# Patient Record
Sex: Male | Born: 1990 | Race: White | Hispanic: No | Marital: Single | Smoking: Never smoker
Health system: Southern US, Community
[De-identification: ages and names within clinical notes are randomized; demographics above are authoritative.]

## PROBLEM LIST (undated history)

## (undated) DIAGNOSIS — R29898 Other symptoms and signs involving the musculoskeletal system: Secondary | ICD-10-CM

## (undated) DIAGNOSIS — R55 Syncope and collapse: Secondary | ICD-10-CM

## (undated) DIAGNOSIS — H539 Unspecified visual disturbance: Secondary | ICD-10-CM

## (undated) DIAGNOSIS — I471 Supraventricular tachycardia, unspecified: Secondary | ICD-10-CM

## (undated) HISTORY — DX: Other symptoms and signs involving the musculoskeletal system: R29.898

## (undated) HISTORY — PX: MANDIBLE FRACTURE SURGERY: SHX706

## (undated) HISTORY — DX: Unspecified visual disturbance: H53.9

## (undated) HISTORY — PX: KNEE SURGERY: SHX244

---
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
## 2013-12-15 ENCOUNTER — Encounter (HOSPITAL_COMMUNITY): Payer: Self-pay | Admitting: Emergency Medicine

## 2013-12-15 DIAGNOSIS — S1093XA Contusion of unspecified part of neck, initial encounter: Principal | ICD-10-CM

## 2013-12-15 DIAGNOSIS — S0003XA Contusion of scalp, initial encounter: Secondary | ICD-10-CM | POA: Diagnosis not present

## 2013-12-15 DIAGNOSIS — S0990XA Unspecified injury of head, initial encounter: Secondary | ICD-10-CM | POA: Diagnosis present

## 2013-12-15 DIAGNOSIS — Y9289 Other specified places as the place of occurrence of the external cause: Secondary | ICD-10-CM | POA: Insufficient documentation

## 2013-12-15 DIAGNOSIS — Z79899 Other long term (current) drug therapy: Secondary | ICD-10-CM | POA: Diagnosis not present

## 2013-12-15 DIAGNOSIS — Y9389 Activity, other specified: Secondary | ICD-10-CM | POA: Diagnosis not present

## 2013-12-15 DIAGNOSIS — S0083XA Contusion of other part of head, initial encounter: Principal | ICD-10-CM | POA: Insufficient documentation

## 2013-12-15 DIAGNOSIS — W2209XA Striking against other stationary object, initial encounter: Secondary | ICD-10-CM | POA: Diagnosis not present

## 2013-12-15 NOTE — ED Notes (Signed)
Pt. accidentally hit his head against a wall at work this evening [employer] ) , no LOC /ambulatory , alert and oriented , reports pain at right eye / right headache.

## 2013-12-16 ENCOUNTER — Emergency Department (HOSPITAL_COMMUNITY)
Admission: EM | Admit: 2013-12-16 | Discharge: 2013-12-16 | Disposition: A | Discharge status: Home / Self Care | Payer: Worker's Compensation | Attending: Emergency Medicine | Admitting: Emergency Medicine

## 2013-12-16 DIAGNOSIS — S0093XA Contusion of unspecified part of head, initial encounter: Secondary | ICD-10-CM

## 2013-12-16 MED ORDER — NAPROXEN 500 MG PO TABS: 500.0000 mg | ORAL_TABLET | Freq: Two times a day (BID) | ORAL | Status: DC

## 2013-12-16 NOTE — ED Provider Notes (Signed)
CSN: [id number]     Arrival date & time 12/15/13  2240 History   First MD Initiated Contact with Patient 12/16/13 0113     Chief Complaint  Patient presents with  . Head Injury     (Consider location/radiation/quality/duration/timing/severity/associated sxs/prior Treatment) Patient is a 23 y.o. male presenting with head injury.  Head Injury  Ian Lewis is a 23 y.o. male who reports hitting his head into a beam around 11:15pm yesterday evening. Pt reports he turned around too quickly and hit his head on an I beam. He initially felt lightheaded, but did not lose consciousness. He denies N/V, No visual disturbances. No cervical spine tenderness. He reports a HA that comes and goes and rates it at about 4-5.   However, he says he is comfortable now and does not want anything for pain. He was able to accurately complete the [facility] post-traumatic  Amnesia scale in the room. No history of coagulopathies. Denies any cp, SOB, Abd pain, weakness or numbness History reviewed. No pertinent past medical history. Past Surgical History  Procedure Laterality Date  . Knee surgery    . Mandible fracture surgery     No family history on file. History  Substance Use Topics  . Smoking status: Never Smoker   . Smokeless tobacco: Not on file  . Alcohol Use: No    Review of Systems  All other systems reviewed and are negative.     Allergies  Lorabid  Home Medications   Prior to Admission medications   Medication Sig Start Date End Date Taking? Authorizing Provider  Multiple Vitamins-Minerals (MULTIVITAMINS THER. W/MINERALS) TABS Take 1 tablet by mouth daily.      Historical Provider, MD  naproxen (NAPROSYN) 500 MG tablet Take 1 tablet (500 mg total) by mouth 2 (two) times daily. 12/16/13   Viona Gilmore Jasemine Nawaz, PA-C   BP 119/79  Pulse 69  Temp(Src) 98.3 F (36.8 C) (Oral)  Resp 19  Ht 5\' 6"  (1.676 m)  Wt 150 lb (68.04 kg)  BMI 24.22 kg/m2  SpO2 100% Physical Exam  Nursing note and  vitals reviewed. Constitutional: He is oriented to person, place, and time.  Awake, alert, nontoxic appearance.  HENT:  Localized edema and swelling superior to the R eyebrow approximately 4 cm in diameter and 2 cm in depth.  No sign of basilar skull frx, no Battle's sign or raccoon eyes.  Eyes: Conjunctivae and EOM are normal. Pupils are equal, round, and reactive to light. Right eye exhibits no discharge. Left eye exhibits no discharge. No scleral icterus.  Neck: Neck supple.  Cardiovascular: Normal rate, regular rhythm, normal heart sounds and intact distal pulses.  Exam reveals no gallop and no friction rub.   No murmur heard. Pulmonary/Chest: Effort normal. He exhibits no tenderness.  Abdominal: Soft. There is no tenderness. There is no rebound.  Musculoskeletal: He exhibits no tenderness.  Baseline ROM, no obvious new focal weakness.  Neurological: He is alert and oriented to person, place, and time.  Mental status and motor strength appears baseline for patient and situation.  Skin: No rash noted.  Psychiatric: He has a normal mood and affect.    ED Course  Procedures (including critical care time) Labs Review Labs Reviewed - No data to display  Imaging Review No results found.   EKG Interpretation None      MDM  Pt resting comfortably in ED.  Vitals stable He is alert and oriented, speaking in full sentences. No LOC, No N/V, No spine  tenderness, Passed WBTAS in room. Will DC with Aleve for pain management  Final diagnoses:  Head contusion, initial encounter        Verl Dicker, PA-C 12/16/13 0159

## 2013-12-16 NOTE — ED Provider Notes (Signed)
Medical screening examination/treatment/procedure(s) were performed by non-physician practitioner and as supervising physician I was immediately available for consultation/collaboration.    Johnna Acosta, MD 12/16/13 0400

## 2013-12-16 NOTE — Discharge Instructions (Signed)
Contusion A contusion is a deep bruise. Contusions are the result of an injury that caused bleeding under the skin. The contusion may turn blue, purple, or yellow. Minor injuries will give you a painless contusion, but more severe contusions may stay painful and swollen for a few weeks.  CAUSES  A contusion is usually caused by a blow, trauma, or direct force to an area of the body. SYMPTOMS   Swelling and redness of the injured area.  Bruising of the injured area.  Tenderness and soreness of the injured area.  Pain. DIAGNOSIS  The diagnosis can be made by taking a history and physical exam. An X-ray, CT scan, or MRI may be needed to determine if there were any associated injuries, such as fractures. TREATMENT  Specific treatment will depend on what area of the body was injured. In general, the best treatment for a contusion is resting, icing, elevating, and applying cold compresses to the injured area. Over-the-counter medicines may also be recommended for pain control. Ask your caregiver what the best treatment is for your contusion. HOME CARE INSTRUCTIONS   Put ice on the injured area.  Put ice in a plastic bag.  Place a towel between your skin and the bag.  Leave the ice on for 15-20 minutes, 3-4 times a day, or as directed by your health care provider.  Only take over-the-counter or prescription medicines for pain, discomfort, or fever as directed by your caregiver. Your caregiver may recommend avoiding anti-inflammatory medicines (aspirin, ibuprofen, and naproxen) for 48 hours because these medicines may increase bruising.  Rest the injured area.  If possible, elevate the injured area to reduce swelling. SEEK IMMEDIATE MEDICAL CARE IF:   You have increased bruising or swelling.  You have pain that is getting worse.  Your swelling or pain is not relieved with medicines. MAKE SURE YOU:   Understand these instructions.  Will watch your condition.  Will get help right  away if you are not doing well or get worse. Document Released: 02/06/2005 Document Revised: 05/04/2013 Document Reviewed: 03/04/2011 [facility] Patient Information 2015 [place], [place]. This information is not intended to replace advice given to you by your health care provider. Make sure you discuss any questions you have with your health care provider.   If symptoms progress, you begin to experience N/V, fevers, changes in vision, return to ED for re-evaluation.

## 2014-05-05 ENCOUNTER — Ambulatory Visit (INDEPENDENT_AMBULATORY_CARE_PROVIDER_SITE_OTHER): Payer: Self-pay | Admitting: Emergency Medicine

## 2014-05-05 VITALS — BP 110/78 | HR 96 | Temp 98.0°F | Resp 18 | Ht 66.0 in | Wt 158.0 lb

## 2014-05-05 DIAGNOSIS — J209 Acute bronchitis, unspecified: Secondary | ICD-10-CM

## 2014-05-05 DIAGNOSIS — J018 Other acute sinusitis: Secondary | ICD-10-CM

## 2014-05-05 MED ORDER — AMOXICILLIN-POT CLAVULANATE 875-125 MG PO TABS: 1.0000 | ORAL_TABLET | Freq: Two times a day (BID) | ORAL | Status: DC

## 2014-05-05 MED ORDER — PSEUDOEPHEDRINE-GUAIFENESIN ER 60-600 MG PO TB12: 1.0000 | ORAL_TABLET | Freq: Two times a day (BID) | ORAL | Status: AC

## 2014-05-05 MED ORDER — PROMETHAZINE-CODEINE 6.25-10 MG/5ML PO SYRP: 5.0000 mL | ORAL_SOLUTION | ORAL | Status: DC | PRN

## 2014-05-05 NOTE — Progress Notes (Signed)
[facility] [address] [place] [address] [phone]  Date:  05/05/2014   Name:  Ian Lewis   DOB:  06-11-1990   MRN:  [id number]  PCP:  No PCP Per Patient    Chief Complaint: Cough and Nasal Congestion   History of Present Illness:  Ian Lewis is a 23 y.o. very pleasant male patient who presents with the following:  Friday had ear pain associated with a sore throat. That has resolved.  Now has a cough productive of purulent sputum. No wheezing but some exertional shortness of breath.   No nausea or vomiting no stool change  no coryza No fever or chills No improvement with over the counter medications or other home remedies. Denies other complaint or health concern today.   There are no active problems to display for this patient.   History reviewed. No pertinent past medical history.  Past Surgical History  Procedure Laterality Date  . Knee surgery    . Mandible fracture surgery      History  Substance Use Topics  . Smoking status: Never Smoker   . Smokeless tobacco: Not on file  . Alcohol Use: No    History reviewed. No pertinent family history.  Allergies  Allergen Reactions  . Lorabid [Loracarbef] Hives    Medication list has been reviewed and updated.  Current Outpatient Prescriptions on File Prior to Visit  Medication Sig Dispense Refill  . Multiple Vitamins-Minerals (MULTIVITAMINS THER. W/MINERALS) TABS Take 1 tablet by mouth daily.      . naproxen (NAPROSYN) 500 MG tablet Take 1 tablet (500 mg total) by mouth 2 (two) times daily. (Patient not taking: Reported on 05/05/2014) 30 tablet 0   No current facility-administered medications on file prior to visit.    Review of Systems:  As per HPI, otherwise negative.    Physical Examination: Filed Vitals:   05/05/14 1048  BP: 110/78  Pulse: 96  Temp: 98 F (36.7 C)  Resp: 18   Filed Vitals:   05/05/14 1048  Height: 5\' 6"  (1.676 m)  Weight: 158 lb  (71.668 kg)   Body mass index is 25.51 kg/(m^2). Ideal Body Weight: Weight in (lb) to have BMI = 25: 154.6  GEN: WDWN, NAD, Non-toxic, A & O x 3 HEENT: Atraumatic, Normocephalic. Neck supple. No masses, No LAD. Ears and Nose: No external deformity.  Purulent nasal drainage CV: RRR, No M/G/R. No JVD. No thrill. No extra heart sounds. PULM: CTA B, no wheezes, crackles, rhonchi. No retractions. No resp. distress. No accessory muscle use. ABD: S, NT, ND, +BS. No rebound. No HSM. EXTR: No c/c/e NEURO Normal gait.  PSYCH: Normally interactive. Conversant. Not depressed or anxious appearing.  Calm demeanor.    Assessment and Plan: Sinusitis Bronchitis augmentin mucinex  Phen c c od  Signed,  Ellison Carwin, MD

## 2014-05-05 NOTE — Patient Instructions (Signed)

## 2016-09-17 ENCOUNTER — Encounter (HOSPITAL_COMMUNITY): Payer: Self-pay | Admitting: Emergency Medicine

## 2016-09-17 ENCOUNTER — Emergency Department (HOSPITAL_COMMUNITY): Payer: Medicaid Other

## 2016-09-17 ENCOUNTER — Emergency Department (HOSPITAL_COMMUNITY)
Admission: EM | Admit: 2016-09-17 | Discharge: 2016-09-17 | Disposition: A | Discharge status: Home / Self Care | Payer: Medicaid Other | Attending: Emergency Medicine | Admitting: Emergency Medicine

## 2016-09-17 DIAGNOSIS — R55 Syncope and collapse: Secondary | ICD-10-CM | POA: Insufficient documentation

## 2016-09-17 LAB — CBC
HCT: 46 % (ref 39.0–52.0)
Hemoglobin: 16.2 g/dL (ref 13.0–17.0)
MCH: 29.6 pg (ref 26.0–34.0)
MCHC: 35.2 g/dL (ref 30.0–36.0)
MCV: 84.1 fL (ref 78.0–100.0)
PLATELETS: 243 10*3/uL (ref 150–400)
RBC: 5.47 MIL/uL (ref 4.22–5.81)
RDW: 12.3 % (ref 11.5–15.5)
WBC: 9.1 10*3/uL (ref 4.0–10.5)

## 2016-09-17 LAB — CBG MONITORING, ED: GLUCOSE-CAPILLARY: 95 mg/dL (ref 65–99)

## 2016-09-17 LAB — BASIC METABOLIC PANEL
Anion gap: 7 (ref 5–15)
BUN: 12 mg/dL (ref 6–20)
CALCIUM: 8.9 mg/dL (ref 8.9–10.3)
CHLORIDE: 108 mmol/L (ref 101–111)
CO2: 24 mmol/L (ref 22–32)
CREATININE: 0.88 mg/dL (ref 0.61–1.24)
Glucose, Bld: 67 mg/dL (ref 65–99)
Potassium: 3.5 mmol/L (ref 3.5–5.1)
Sodium: 139 mmol/L (ref 135–145)

## 2016-09-17 LAB — URINALYSIS, ROUTINE W REFLEX MICROSCOPIC
BILIRUBIN URINE: NEGATIVE
GLUCOSE, UA: NEGATIVE mg/dL
HGB URINE DIPSTICK: NEGATIVE
Ketones, ur: NEGATIVE mg/dL
Leukocytes, UA: NEGATIVE
Nitrite: NEGATIVE
PROTEIN: NEGATIVE mg/dL
Specific Gravity, Urine: 1.008 (ref 1.005–1.030)
pH: 6 (ref 5.0–8.0)

## 2016-09-17 MED ORDER — SODIUM CHLORIDE 0.9 % IV BOLUS (SEPSIS)
1000.0000 mL | Freq: Once | INTRAVENOUS | Status: AC
  Administered 2016-09-17: 1000 mL via INTRAVENOUS

## 2016-09-17 NOTE — Discharge Instructions (Signed)
Drink plenty of fluids and get plenty of rest.  Be sure to stay hydrated when working outdoors.  Return to the emergency department if your symptoms significantly worsen or change.

## 2016-09-17 NOTE — ED Notes (Signed)
ED Provider at bedside. 

## 2016-09-17 NOTE — ED Triage Notes (Signed)
Pt arrives to ER via [facility] from work place where patient had witnessed syncopal episode, no fall or injury. Pt is a/o x4. Reports tingling sensation to left foot.

## 2016-09-17 NOTE — ED Notes (Signed)
Pt reports he has been working outside all day and has had minimal water intake. EMS gave 500 cc in route NS.

## 2016-09-17 NOTE — ED Notes (Signed)
Pt sts he passed out today while at work, pt was working outside, felt hot and sat down, took 2 tylenols and started to become sleepy, sat down and per staff pt passed out and fell on ground. Per staff pt did not hit head. Pt denies pain besides left sided chest pain that started while he was waiting in the waiting room. 1/10. Pt states he does feel a little dizzy now. VSS

## 2016-09-17 NOTE — ED Notes (Signed)
Patient transported to CT 

## 2016-09-17 NOTE — ED Provider Notes (Signed)
[facility] DEPT Provider Note   CSN: [id number] Arrival date & time: 09/17/16  1529     History   Chief Complaint Chief Complaint  Patient presents with  . Loss of Consciousness    HPI Ian Lewis is a 26 y.o. male.  Patient is an otherwise healthy 26 year old male who presents for evaluation of an unresponsive episode. He works as a Microbiologist at [employer]. He has been working there the past several days in the sun performing strenuous activities. He reports becoming lightheaded, nauseated, and felt as if he was going to pass out. He then sat on the ground and became unresponsive for several moments. This was witnessed by his coworkers who deny seizure-like activity. He denies any bowel or bladder incontinence and denies any oral trauma. This episode lasted for less than 1 minute, then resolved. There is no reported postictal period.   The history is provided by the patient.  Loss of Consciousness   This is a new problem. The current episode started 1 to 2 hours ago. The problem has been resolved. He lost consciousness for a period of less than one minute. The problem is associated with exertion. Pertinent negatives include bladder incontinence, bowel incontinence, palpitations and visual change. He has tried nothing for the symptoms. The treatment provided no relief.    History reviewed. No pertinent past medical history.  There are no active problems to display for this patient.   Past Surgical History:  Procedure Laterality Date  . KNEE SURGERY    . MANDIBLE FRACTURE SURGERY         Home Medications    Prior to Admission medications   Medication Sig Start Date End Date Taking? Authorizing Provider  amoxicillin-clavulanate (AUGMENTIN) 875-125 MG per tablet Take 1 tablet by mouth 2 (two) times daily. 05/05/14   Roselee Culver, MD  Multiple Vitamins-Minerals (MULTIVITAMINS THER. W/MINERALS) TABS Take 1 tablet by mouth daily.      [provider]   naproxen (NAPROSYN) 500 MG tablet Take 1 tablet (500 mg total) by mouth 2 (two) times daily. Patient not taking: Reported on 05/05/2014 12/16/13   Comer Locket, PA-C  promethazine-codeine (PHENERGAN WITH CODEINE) 6.25-10 MG/5ML syrup Take 5-10 mLs by mouth every 4 (four) hours as needed for cough. 05/05/14   Roselee Culver, MD    Family History History reviewed. No pertinent family history.  Social History Social History  Substance Use Topics  . Smoking status: Never Smoker  . Smokeless tobacco: Never Used  . Alcohol use No     Allergies   Lorabid [loracarbef]   Review of Systems Review of Systems  Cardiovascular: Positive for syncope. Negative for palpitations.  Gastrointestinal: Negative for bowel incontinence.  Genitourinary: Negative for bladder incontinence.  All other systems reviewed and are negative.    Physical Exam Updated Vital Signs BP 129/89 (BP Location: Right Arm)   Pulse 83   Temp 98.3 F (36.8 C) (Oral)   Resp 14   SpO2 99%   Physical Exam  Constitutional: He is oriented to person, place, and time. He appears well-developed and well-nourished. No distress.  HENT:  Head: Normocephalic and atraumatic.  Mouth/Throat: Oropharynx is clear and moist.  Eyes: EOM are normal. Pupils are equal, round, and reactive to light.  Neck: Normal range of motion. Neck supple.  Cardiovascular: Normal rate and regular rhythm.  Exam reveals no friction rub.   No murmur heard. Pulmonary/Chest: Effort normal and breath sounds normal. No respiratory distress. He has no wheezes.  He has no rales.  Abdominal: Soft. Bowel sounds are normal. He exhibits no distension. There is no tenderness.  Musculoskeletal: Normal range of motion. He exhibits no edema.  Neurological: He is alert and oriented to person, place, and time. No cranial nerve deficit. He exhibits normal muscle tone. Coordination normal.  Skin: Skin is warm and dry. He is not diaphoretic.  Nursing note and  vitals reviewed.    ED Treatments / Results  Labs (all labs ordered are listed, but only abnormal results are displayed) Labs Reviewed  URINALYSIS, ROUTINE W REFLEX MICROSCOPIC - Abnormal; Notable for the following:       Result Value   Color, Urine STRAW (*)    All other components within normal limits  BASIC METABOLIC PANEL  CBC  CBG MONITORING, ED    EKG  EKG Interpretation None       Radiology No results found.  Procedures Procedures (including critical care time)  Medications Ordered in ED Medications  sodium chloride 0.9 % bolus 1,000 mL (not administered)     Initial Impression / Assessment and Plan / ED Course  I have reviewed the triage vital signs and the nursing notes.  Pertinent labs & imaging results that were available during my care of the patient were reviewed by me and considered in my medical decision making (see chart for details).  Patient presents here after some sort of syncopal episode while working outside for the past 2 days. It appears as though his symptoms are related to some sort of heat exhaustion as I have found no other obvious cause. He appears well-hydrated and his laboratory studies are unremarkable. He was given 1 L of normal saline and underwent CT scan of the head. This was negative. His vitals are stable and he appears well. He will be discharged, to follow-up as needed for any problems.  There was no reported shaking, no oral trauma, no bowel or bladder incontinence, and no confusion when he woke up that would suggest a seizure.  Final Clinical Impressions(s) / ED Diagnoses   Final diagnoses:  None    New Prescriptions New Prescriptions   No medications on file     Veryl Speak, MD 09/17/16 [phone]

## 2017-12-12 ENCOUNTER — Emergency Department (HOSPITAL_COMMUNITY)
Admission: EM | Admit: 2017-12-12 | Discharge: 2017-12-13 | Disposition: A | Discharge status: Home / Self Care | Payer: Self-pay | Attending: Emergency Medicine | Admitting: Emergency Medicine

## 2017-12-12 ENCOUNTER — Other Ambulatory Visit: Payer: Self-pay

## 2017-12-12 ENCOUNTER — Emergency Department (HOSPITAL_COMMUNITY): Payer: Self-pay

## 2017-12-12 ENCOUNTER — Encounter (HOSPITAL_COMMUNITY): Payer: Self-pay

## 2017-12-12 DIAGNOSIS — R531 Weakness: Secondary | ICD-10-CM | POA: Insufficient documentation

## 2017-12-12 DIAGNOSIS — R002 Palpitations: Secondary | ICD-10-CM | POA: Insufficient documentation

## 2017-12-12 DIAGNOSIS — R29898 Other symptoms and signs involving the musculoskeletal system: Secondary | ICD-10-CM

## 2017-12-12 DIAGNOSIS — Z79899 Other long term (current) drug therapy: Secondary | ICD-10-CM | POA: Insufficient documentation

## 2017-12-12 LAB — BASIC METABOLIC PANEL
Anion gap: 8 (ref 5–15)
BUN: 14 mg/dL (ref 6–20)
CALCIUM: 9 mg/dL (ref 8.9–10.3)
CO2: 23 mmol/L (ref 22–32)
CREATININE: 0.93 mg/dL (ref 0.61–1.24)
Chloride: 108 mmol/L (ref 98–111)
GFR calc Af Amer: >60 mL/min (ref >60)
GLUCOSE: 95 mg/dL (ref 70–99)
Potassium: 4.2 mmol/L (ref 3.5–5.1)
Sodium: 139 mmol/L (ref 135–145)

## 2017-12-12 LAB — CBC
HCT: 49.7 % (ref 39.0–52.0)
HEMOGLOBIN: 17.1 g/dL — AB (ref 13.0–17.0)
MCH: 29.9 pg (ref 26.0–34.0)
MCHC: 34.4 g/dL (ref 30.0–36.0)
MCV: 87 fL (ref 78.0–100.0)
Platelets: 300 10*3/uL (ref 150–400)
RBC: 5.71 MIL/uL (ref 4.22–5.81)
RDW: 12.1 % (ref 11.5–15.5)
WBC: 8 10*3/uL (ref 4.0–10.5)

## 2017-12-12 LAB — I-STAT TROPONIN, ED: TROPONIN I, POC: 0 ng/mL (ref 0.00–0.08)

## 2017-12-12 NOTE — ED Notes (Signed)
Checked pulse in left foot. VERY good pulses

## 2017-12-12 NOTE — ED Triage Notes (Signed)
Pt states that yesterday he started having palpitations and noticed yesterday afternoon he lost movement in his left foot. Both feet have delayed cap refill, with strong pedal pulses. Pt states he has had a similar episode in February of this year with loss of mobility that he eventually got back over a weeks time.

## 2017-12-12 NOTE — ED Triage Notes (Signed)
Pt concerned b/c his foot is cold and has dec cap refill. Based on PA assessment during "quicklook" assessment was the same then. Pt taken to triage for reassessment.

## 2017-12-12 NOTE — ED Provider Notes (Signed)
Patient placed in Quick Look pathway, seen and evaluated   Chief Complaint: Left foot paralyzed  HPI:   27 year old presents with cold, numb, paralyzed L ankle/foot. He states his symptoms started 11:50 AM yesterday and he had palpitations with this but no syncope. He went to lunch with dad and then felt like his foot was falling asleep and then 12:20PM he couldn't move his foot. He can bend the knee but can't move ankle/foot. He states the foot feels very cold. This has happened before in [place] [employer] where he was going to school. He would pass out and his L arm went numb. Feeling came back a day later. Another time his hands and feet went numb after passing out and it took a week to get better. Has had extensive testing and and vascular tests.  Everything came back normal. He came today because he thought his foot was turning blue  ROS: +Can't move foot, not feeling  Physical Exam:   Gen: No distress  Neuro: Awake and Alert  Skin: Warm    Focused Exam: Left foot: Foot appears perfused. Decreased cap refill. Subjective paralysis and numbness of the ankle and foot. 2+ DP pulse.     Initiation of care has begun. The patient has been counseled on the process, plan, and necessity for staying for the completion/evaluation, and the remainder of the medical screening examination    Recardo Evangelist, PA-C 12/12/17 Merleen Milliner, MD 12/13/17 0000

## 2017-12-13 DIAGNOSIS — M79672 Pain in left foot: Secondary | ICD-10-CM

## 2017-12-13 NOTE — ED Notes (Signed)
Vascular at bedside

## 2017-12-13 NOTE — Progress Notes (Signed)
Orthopedic Tech Progress Note Patient Details:  Ian Lewis 12-Nov-1990 [id number]  Ortho Devices Type of Ortho Device: Prafo boot/shoe, Crutches Ortho Device/Splint Location: lle  Ortho Device/Splint Interventions: Ordered, Adjustment, Application   Post Interventions Patient Tolerated: Well Instructions Provided: Care of device, Adjustment of device Dr needed something for foot drop. I suggested a prafo. Dr agreed and approved so I applied the boot.  Karolee Stamps 12/13/2017, 2:26 AM

## 2017-12-13 NOTE — ED Provider Notes (Signed)
[facility] EMERGENCY DEPARTMENT Provider Note   CSN: [id number] Arrival date & time: 12/12/17  1734     History   Chief Complaint Chief Complaint  Patient presents with  . Palpitations  . Skin Discoloration    HPI Ian Lewis is a 27 y.o. male.  The history is provided by the patient.  Extremity Weakness  This is a new problem. The current episode started yesterday. The problem occurs constantly. The problem has not changed since onset.Pertinent negatives include no chest pain, no abdominal pain, no headaches and no shortness of breath. The symptoms are aggravated by walking. Nothing relieves the symptoms.  Patient reports that on August 1 he had abrupt onset of palpitations, and then later developed left foot numbness and discoloration.  He also reports weakness in the left foot.  It has not improved.  He did wait to see if there was improvement, but it did not.  He denies any other weakness or numbness in his arms or legs.  There is no headache.  No new back pain. He endorses episodes of syncope and numbness in extremities in the past while he was in college in [place].  There is no syncope at this time   PMH-none Past Surgical History:  Procedure Laterality Date  . KNEE SURGERY    . MANDIBLE FRACTURE SURGERY          Home Medications    Prior to Admission medications   Medication Sig Start Date End Date Taking? Authorizing Provider  acetaminophen (TYLENOL) 500 MG tablet Take 1,000 mg by mouth every 6 (six) hours as needed.    [provider]  amoxicillin-clavulanate (AUGMENTIN) 875-125 MG per tablet Take 1 tablet by mouth 2 (two) times daily. Patient not taking: Reported on 09/17/2016 05/05/14   Roselee Culver, MD  loratadine (CLARITIN) 10 MG tablet Take 20 mg by mouth daily as needed for allergies.    [provider]  Multiple Vitamins-Minerals (MULTIVITAMINS THER. W/MINERALS) TABS Take 1 tablet by mouth daily.       [provider]  naproxen (NAPROSYN) 500 MG tablet Take 1 tablet (500 mg total) by mouth 2 (two) times daily. Patient not taking: Reported on 05/05/2014 12/16/13   Comer Locket, PA-C  promethazine-codeine (PHENERGAN WITH CODEINE) 6.25-10 MG/5ML syrup Take 5-10 mLs by mouth every 4 (four) hours as needed for cough. Patient not taking: Reported on 09/17/2016 05/05/14   Roselee Culver, MD    Family History No family history on file.  Social History Social History   Tobacco Use  . Smoking status: Never Smoker  . Smokeless tobacco: Never Used  Substance Use Topics  . Alcohol use: No  . Drug use: No     Allergies   Lorabid [loracarbef]   Review of Systems Review of Systems  Constitutional: Negative for fever.  Respiratory: Negative for shortness of breath.   Cardiovascular: Positive for palpitations. Negative for chest pain.  Gastrointestinal: Negative for abdominal pain.  Musculoskeletal: Positive for extremity weakness. Negative for back pain and neck pain.  Skin: Positive for color change.  Neurological: Positive for weakness and numbness. Negative for syncope and headaches.  All other systems reviewed and are negative.    Physical Exam Updated Vital Signs BP 112/73   Pulse 63   Temp 98.9 F (37.2 C) (Oral)   Resp 14   Ht 1.676 m (5\' 6" )   Wt 81.6 kg (180 lb)   SpO2 97%   BMI 29.05 kg/m  Physical Exam CONSTITUTIONAL: Well developed/well nourished HEAD: Normocephalic/atraumatic EYES: EOMI/PERRL ENMT: Mucous membranes moist NECK: supple no meningeal signs CV: S1/S2 noted, no murmurs/rubs/gallops noted LUNGS: Lungs are clear to auscultation bilaterally, no apparent distress ABDOMEN: soft, nontender, no rebound or guarding GU:no cva tenderness NEURO:Awake/alert, face symmetric, no arm or leg drift is noted Equal 5/5 strength with shoulder abduction, elbow flex/extension, wrist flex/extension in upper extremities and equal hand grips  bilaterally Equal 5/5 strength with hip flexion,knee flex/extension,  Right foot dorsi/plantar flexion appropriate He is unable to dorsi/plantar flex left foot.  He is insensinate in left foot Cranial nerves 3/4/5/6/11/18/08/11/12 tested and intact No clonus.  Equal reflexes in lower extremities EXTREMITIES: left foot is cooler to touch with mild change in color. Distal cap refill delayed left foot.  Equal femoral/popliteal/DP/PT pulses in both LE SKIN: warm, color normal PSYCH: no abnormalities of mood noted    ED Treatments / Results  Labs (all labs ordered are listed, but only abnormal results are displayed) Labs Reviewed  CBC - Abnormal; Notable for the following components:      Result Value   Hemoglobin 17.1 (*)    All other components within normal limits  BASIC METABOLIC PANEL  I-STAT TROPONIN, ED    EKG EKG Interpretation  Date/Time:  Friday December 12 2017 18:24:08 EDT Ventricular Rate:  84 PR Interval:  114 QRS Duration: 78 QT Interval:  344 QTC Calculation: 406 R Axis:   82 Text Interpretation:  Sinus rhythm with marked sinus arrhythmia Otherwise normal ECG No significant change since last tracing Confirmed by Duffy Bruce [id number]) on 12/12/2017 6:44:02 PM   Radiology Dg Chest 2 View  Result Date: 12/12/2017 CLINICAL DATA:  Palpitations. EXAM: CHEST - 2 VIEW COMPARISON:  None. FINDINGS: The heart size and mediastinal contours are within normal limits. Both lungs are clear. The visualized skeletal structures are unremarkable. IMPRESSION: Normal examination. Electronically Signed   By: Claudie Revering M.D.   On: 12/12/2017 19:16    Procedures Procedures  Medications Ordered in ED Medications - No data to display   Initial Impression / Assessment and Plan / ED Course  I have reviewed the triage vital signs and the nursing notes.  Pertinent labs  results that were available during my care of the patient were reviewed by me and considered in my medical decision  making (see chart for details).     12:45 AM Unclear cause of weakness and numbness in left foot.  He has pulses, but there is a temperature difference and delayed cap refill.  No signs of A. fib at this time, but it is possible he has paroxysmal A. fib.  This could reflect a vascular/embolic phenomenon.  I discussed the case with Dr. Oneida Alar with vascular, who will see patient    Seen by vascular surgery.  They do not feel this represents an acute vascular emergency, they were concerned with a possible neurologic process.  It is only isolated to the foot .  He has no other signs of stroke.  No signs of any radiculopathy.  It is possible this is a peripheral neuropathy.  Patient was given appropriate crutches as well as a boot to assist with foot drop. Will be referred to neurology as an outpatient next week, as he may need further EMG/conduction studies Answered all questions, patient feels appropriate for discharge. Final Clinical Impressions(s) / ED Diagnoses   Final diagnoses:  Weakness of left foot    ED Discharge Orders    None  Ripley Fraise, MD 12/13/17 [phone]

## 2017-12-13 NOTE — ED Notes (Signed)
Dr.Wickline at bedside with ortho tech

## 2017-12-13 NOTE — Consult Note (Signed)
Referring Physician: Dr Christy Gentles  Patient name: Ian Lewis MRN: [id number] DOB: 04-Aug-1990 Sex: male  REASON FOR CONSULT: rule out left foot ischemia  HPI: [name] is a 27 y.o. male with several hour history of numbness coolness in left foot.  He denies chest pain.  He states he had some palpitations earlier today but has no history of afib.  He currently is unable to move the left foot.  No family history of neurologic diseases.   He had a similar episode about a year ago which affected both hands with numbness and left leg. He states that currently the foot has no sensation and feels cool.  History reviewed. No pertinent past medical history. Past Surgical History:  Procedure Laterality Date  . KNEE SURGERY    . MANDIBLE FRACTURE SURGERY      No family history on file.  SOCIAL HISTORY: Social History   Socioeconomic History  . Marital status: Single    Spouse name: Not on file  . Number of children: Not on file  . Years of education: Not on file  . Highest education level: Not on file  Occupational History  . Not on file  Social Needs  . Financial resource strain: Not on file  . Food insecurity:    Worry: Not on file    Inability: Not on file  . Transportation needs:    Medical: Not on file    Non-medical: Not on file  Tobacco Use  . Smoking status: Never Smoker  . Smokeless tobacco: Never Used  Substance and Sexual Activity  . Alcohol use: No  . Drug use: No  . Sexual activity: Not on file  Lifestyle  . Physical activity:    Days per week: Not on file    Minutes per session: Not on file  . Stress: Not on file  Relationships  . Social connections:    Talks on phone: Not on file    Gets together: Not on file    Attends religious service: Not on file    Active member of club or organization: Not on file    Attends meetings of clubs or organizations: Not on file    Relationship status: Not on file  . Intimate partner violence:    Fear of  current or ex partner: Not on file    Emotionally abused: Not on file    Physically abused: Not on file    Forced sexual activity: Not on file  Other Topics Concern  . Not on file  Social History Narrative  . Not on file    Allergies  Allergen Reactions  . Lorabid [Loracarbef] Hives    No current facility-administered medications for this encounter.    Current Outpatient Medications  Medication Sig Dispense Refill  . acetaminophen (TYLENOL) 500 MG tablet Take 1,000 mg by mouth every 6 (six) hours as needed.    Marland Kitchen amoxicillin-clavulanate (AUGMENTIN) 875-125 MG per tablet Take 1 tablet by mouth 2 (two) times daily. (Patient not taking: Reported on 09/17/2016) 20 tablet 0  . loratadine (CLARITIN) 10 MG tablet Take 20 mg by mouth daily as needed for allergies.    . Multiple Vitamins-Minerals (MULTIVITAMINS THER. W/MINERALS) TABS Take 1 tablet by mouth daily.      . naproxen (NAPROSYN) 500 MG tablet Take 1 tablet (500 mg total) by mouth 2 (two) times daily. (Patient not taking: Reported on 05/05/2014) 30 tablet 0  . promethazine-codeine (PHENERGAN WITH CODEINE) 6.25-10 MG/5ML syrup Take 5-10 mLs by  mouth every 4 (four) hours as needed for cough. (Patient not taking: Reported on 09/17/2016) 120 mL 0    ROS:   General:  No weight loss, Fever, chills  HEENT: No recent headaches, no nasal bleeding, no visual changes, no sore throat  Neurologic: No dizziness, blackouts, seizures. No recent symptoms of stroke or mini- stroke. No recent episodes of slurred speech, or temporary blindness.  Cardiac: No recent episodes of chest pain/pressure, no shortness of breath at rest.  No shortness of breath with exertion.  Denies history of atrial fibrillation   Vascular: No history of rest pain in feet.  No history of claudication.  No history of non-healing ulcer, No history of DVT   Pulmonary: No home oxygen, no productive cough, no hemoptysis,  No asthma or wheezing  Musculoskeletal:  [ ]  Arthritis, [  ] Low back pain,  [ ]  Joint pain  Hematologic:No history of hypercoagulable state.  No history of easy bleeding.  No history of anemia  Gastrointestinal: No hematochezia or melena,  No gastroesophageal reflux, no trouble swallowing  Urinary: [ ]  chronic Kidney disease, [0] on HD - [ ]  MWF or [ ]  TTHS, [ ]  Burning with urination, [ ]  Frequent urination, [ ]  Difficulty urinating;   Skin: No rashes  Psychological: No history of anxiety,  No history of depression   Physical Examination  Vitals:   12/12/17 1818 12/12/17 2257 12/13/17 0015  BP: (!) 129/95 135/82 112/73  Pulse: (!) 122 74 63  Resp: 20 14   Temp: 98.9 F (37.2 C)    TempSrc: Oral    SpO2: 100% 100% 97%  Weight: 180 lb (81.6 kg)    Height: 5\' 6"  (1.676 m)      Body mass index is 29.05 kg/m.  General:  Alert and oriented, no acute distress HEENT: Normal Neck: No JVD Pulmonary: Clear to auscultation bilaterally Cardiac: Regular Rate and Rhythm without murmur Abdomen: Soft, non-tender, non-distended, no mass Skin: No rash, feet symmetrically cool Extremity Pulses:  2+ radial, brachial, femoral, dorsalis pedis, posterior tibial pulses bilaterally Musculoskeletal: No deformity or edema  Neurologic: Upper and lower extremity motor 5/5 and symmetric with exception of left foot insensate unable to move toes or ankle  DATA:  CBC    Component Value Date/Time   WBC 8.0 12/12/2017 1820   RBC 5.71 12/12/2017 1820   HGB 17.1 (H) 12/12/2017 1820   HCT 49.7 12/12/2017 1820   PLT 300 12/12/2017 1820   MCV 87.0 12/12/2017 1820   MCH 29.9 12/12/2017 1820   MCHC 34.4 12/12/2017 1820   RDW 12.1 12/12/2017 1820   LYMPHSABS 2.3 01/09/2011 0043   MONOABS 1.1 (H) 01/09/2011 0043   EOSABS 0.1 01/09/2011 0043   BASOSABS 0.0 01/09/2011 0043    BMET    Component Value Date/Time   NA 139 12/12/2017 1820   K 4.2 12/12/2017 1820   CL 108 12/12/2017 1820   CO2 23 12/12/2017 1820   GLUCOSE 95 12/12/2017 1820   BUN 14  12/12/2017 1820   CREATININE 0.93 12/12/2017 1820   CALCIUM 9.0 12/12/2017 1820   GFRNONAA >60 12/12/2017 1820   GFRAA >60 12/12/2017 1820     ASSESSMENT:  Pt with easily palpable DP PT pulse left foot.  Doubt vascular etiology for his symptoms.  Would do further workup neurologically ?MS etc   PLAN:  No further vascular workup.  Will sign off.  Suggest Neurology eval.    Ruta Hinds, MD [facility] Office: [phone] Pager: [phone]

## 2017-12-16 ENCOUNTER — Encounter (HOSPITAL_COMMUNITY): Payer: Self-pay | Admitting: Emergency Medicine

## 2017-12-16 ENCOUNTER — Observation Stay (HOSPITAL_COMMUNITY)
Admission: EM | Admit: 2017-12-16 | Discharge: 2017-12-18 | Disposition: A | Discharge status: Home / Self Care | Payer: Self-pay | Attending: Family Medicine | Admitting: Family Medicine

## 2017-12-16 ENCOUNTER — Ambulatory Visit: Payer: Self-pay | Admitting: Neurology

## 2017-12-16 ENCOUNTER — Other Ambulatory Visit: Payer: Self-pay

## 2017-12-16 ENCOUNTER — Emergency Department (HOSPITAL_COMMUNITY): Payer: Self-pay

## 2017-12-16 DIAGNOSIS — M40204 Unspecified kyphosis, thoracic region: Secondary | ICD-10-CM | POA: Insufficient documentation

## 2017-12-16 DIAGNOSIS — M5124 Other intervertebral disc displacement, thoracic region: Secondary | ICD-10-CM | POA: Insufficient documentation

## 2017-12-16 DIAGNOSIS — M5127 Other intervertebral disc displacement, lumbosacral region: Secondary | ICD-10-CM | POA: Insufficient documentation

## 2017-12-16 DIAGNOSIS — Z8249 Family history of ischemic heart disease and other diseases of the circulatory system: Secondary | ICD-10-CM | POA: Insufficient documentation

## 2017-12-16 DIAGNOSIS — M21372 Foot drop, left foot: Secondary | ICD-10-CM | POA: Insufficient documentation

## 2017-12-16 DIAGNOSIS — F449 Dissociative and conversion disorder, unspecified: Secondary | ICD-10-CM | POA: Insufficient documentation

## 2017-12-16 DIAGNOSIS — R29898 Other symptoms and signs involving the musculoskeletal system: Secondary | ICD-10-CM | POA: Diagnosis present

## 2017-12-16 DIAGNOSIS — M4056 Lordosis, unspecified, lumbar region: Secondary | ICD-10-CM | POA: Insufficient documentation

## 2017-12-16 DIAGNOSIS — R531 Weakness: Principal | ICD-10-CM | POA: Insufficient documentation

## 2017-12-16 DIAGNOSIS — Z881 Allergy status to other antibiotic agents status: Secondary | ICD-10-CM | POA: Insufficient documentation

## 2017-12-16 DIAGNOSIS — R200 Anesthesia of skin: Secondary | ICD-10-CM

## 2017-12-16 DIAGNOSIS — M5126 Other intervertebral disc displacement, lumbar region: Secondary | ICD-10-CM | POA: Insufficient documentation

## 2017-12-16 DIAGNOSIS — D1809 Hemangioma of other sites: Secondary | ICD-10-CM | POA: Insufficient documentation

## 2017-12-16 MED ORDER — GADOBENATE DIMEGLUMINE 529 MG/ML IV SOLN
18.0000 mL | Freq: Once | INTRAVENOUS | Status: AC
  Administered 2017-12-16: 18 mL via INTRAVENOUS

## 2017-12-16 NOTE — ED Notes (Signed)
Patient remains in MRI 

## 2017-12-16 NOTE — ED Provider Notes (Signed)
[place] EMERGENCY DEPARTMENT Provider Note   CSN: [id number] Arrival date & time: 12/16/17  1358     History   Chief Complaint Chief Complaint  Patient presents with  . Foot Pain    cold numb    HPI Ian Lewis is a 27 y.o. male.  Ian Lewis is a 27 y.o. Male with history of prior left ACL repair, otherwise healthy, who presents to the emergency department for evaluation of numbness and inability to move the left foot associated with the foot feeling very cold to the touch.  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  Patient reports he was not scheduled for an appointment until mid September.  He reports he is continued to have numbness and has been unable to move the foot, but today he noticed that the foot felt extremely cold to the touch when compared to the right foot, at one point he felt that it looked wider than usual, but this resolved prior to arrival.  Numbness or weakness has not advanced up the leg, patient denies any associated pain.  Patient had some palpitations before symptoms began on 12/12/2017, but this has not recurred, no history of A. fib or heart conditions, no history of IV drug use.  Patient denies any preceding injury or surgery to the foot.  He soaked the foot and some warm water to see if this would help, but did not see any improvement has not tried anything else to treat his symptoms prior to arrival.  No associated back pain, fevers, urinary incontinence or loss of bowel control.  No associated headaches, vision changes or dizziness.     History reviewed. No pertinent past medical history.  Patient Active Problem List   Diagnosis Date Noted  . Lower extremity weakness 12/17/2017     Past Surgical History:  Procedure Laterality Date  . KNEE SURGERY    . MANDIBLE FRACTURE SURGERY          Home Medications    Prior to Admission medications   Medication Sig Start Date End Date Taking? Authorizing Provider  acetaminophen (TYLENOL) 500 MG tablet Take 1,000 mg by mouth every 6 (six) hours as needed.    [provider]  amoxicillin-clavulanate (AUGMENTIN) 875-125 MG per tablet Take 1 tablet by mouth 2 (two) times daily. Patient not taking: Reported on 09/17/2016 05/05/14   Roselee Culver, MD  loratadine (CLARITIN) 10 MG tablet Take 20 mg by mouth daily as needed for allergies.    [provider]  Multiple Vitamins-Minerals (MULTIVITAMINS THER. W/MINERALS) TABS Take 1 tablet by mouth daily.      [provider]  naproxen (NAPROSYN) 500 MG tablet Take 1 tablet (500 mg total) by mouth 2 (two) times daily. Patient not taking: Reported on 05/05/2014 12/16/13   Comer Locket, PA-C  promethazine-codeine (PHENERGAN WITH CODEINE) 6.25-10 MG/5ML syrup Take 5-10 mLs by mouth every 4 (four) hours as needed for cough. Patient not taking: Reported on 09/17/2016 05/05/14   Roselee Culver, MD    Family History History reviewed. No pertinent family history.  Social History Social History   Tobacco Use  . Smoking status: Never Smoker  . Smokeless tobacco: Never Used  Substance Use Topics  . Alcohol use: No  . Drug  use: No     Allergies   Lorabid [loracarbef]   Review of Systems Review of Systems  Constitutional: Negative for chills and fever.  HENT: Negative.   Eyes: Negative for pain and visual disturbance.  Respiratory: Negative for cough and shortness of breath.   Cardiovascular: Negative for chest pain, palpitations and leg swelling.  Gastrointestinal: Negative for abdominal pain, constipation, nausea and vomiting.  Genitourinary: Negative for difficulty urinating, dysuria and frequency.  Musculoskeletal: Negative for  arthralgias, back pain, myalgias and neck pain.  Skin: Negative for color change and rash.  Neurological: Positive for weakness and numbness.     Physical Exam Updated Vital Signs BP 121/79   Pulse 72   Temp 98.8 F (37.1 C) (Oral)   Resp (!) 22   SpO2 98%   Physical Exam  Constitutional: He is oriented to person, place, and time. He appears well-developed and well-nourished. No distress.  HENT:  Head: Normocephalic and atraumatic.  Mouth/Throat: Oropharynx is clear and moist.  Eyes: Pupils are equal, round, and reactive to light. EOM are normal. Right eye exhibits no discharge. Left eye exhibits no discharge.  Neck: Normal range of motion. Neck supple.  Tenderness to palpation over the C-spine.  Cardiovascular: Normal rate, regular rhythm, normal heart sounds and intact distal pulses.  Pulses:      Radial pulses are 2+ on the right side, and 2+ on the left side.       Dorsalis pedis pulses are 2+ on the right side, and 2+ on the left side.       Posterior tibial pulses are 2+ on the right side.  Pulmonary/Chest: Effort normal and breath sounds normal. No respiratory distress.  Respirations equal and unlabored, patient able to speak in full sentences, lungs clear to auscultation bilaterally  Abdominal: Soft. Bowel sounds are normal. He exhibits no distension and no mass. There is no tenderness. There is no guarding.  Abdomen soft, nondistended, nontender to palpation in all quadrants without guarding or peritoneal signs  Musculoskeletal:  Bilateral lower extremities appear to have normal coloration and are well perfused with 2+ DP pulses bilaterally, the left foot is somewhat colder to the touch than the right in comparison  Neurological: He is alert and oriented to person, place, and time. Coordination normal.  Patient alert and oriented, speech clear, able to follow commands. Cranial nerves III through XII are grossly intact Bilateral upper extremities with normal range of  motion, strength and sensation intact throughout. Left lower extremity with no sensation to soft touch over the left foot, sensation is intact above the ankle throughout the extremity, patient is unable to wiggle left toes or range the foot Right lower extremity with normal sensation to soft touch, normal range of motion and 5/5 strength 2+ DTRs in bilateral lower extremities.  Skin: Skin is warm and dry. Capillary refill takes less than 2 seconds. He is not diaphoretic.  Psychiatric: He has a normal mood and affect. His behavior is normal.  Nursing note and vitals reviewed.    ED Treatments / Results  Labs (all labs ordered are listed, but only abnormal results are displayed) Labs Reviewed  CBC - Abnormal; Notable for the following components:      Result Value   Hemoglobin 17.4 (*)    All other components within normal limits  BASIC METABOLIC PANEL  TSH  HIV ANTIBODY (ROUTINE TESTING)  BASIC METABOLIC PANEL  CBC    EKG None  Radiology Mr [facility] Contrast  Result Date: 12/16/2017 CLINICAL DATA:  Acute onset cold sensation LEFT foot while watching television this afternoon, persistent foot numbness. EXAM: MRI HEAD WITHOUT AND WITH CONTRAST MRI CERVICAL SPINE WITHOUT AND WITH CONTRAST MRI THORACIC AND LUMBAR SPINE WITHOUT AND WITH CONTRAST TECHNIQUE: Multiplanar, multiecho pulse sequences of the brain and surrounding structures, and cervical spine, to include the craniocervical junction and cervicothoracic junction, were obtained without and with intravenous contrast. Multiplanar and multiecho pulse sequences of the thoracic and lumbar spine were obtained without and with intravenous contrast. CONTRAST:  23mL MULTIHANCE GADOBENATE DIMEGLUMINE 529 MG/ML IV SOLN COMPARISON:  CT HEAD Sep 17, 2016 FINDINGS: MRI BRAIN FINDINGS INTRACRANIAL CONTENTS: No reduced diffusion to suggest acute ischemia or hyperacute demyelination. No susceptibility artifact to suggest hemorrhage. The  ventricles and sulci are normal for patient's age. No suspicious parenchymal signal, masses, mass effect. No abnormal intraparenchymal or extra-axial enhancement. No abnormal extra-axial fluid collections. No extra-axial masses. VASCULAR: Normal major intracranial vascular flow voids present at skull base. SKULL AND UPPER CERVICAL SPINE: No abnormal sellar expansion. No suspicious calvarial bone marrow signal. Craniocervical junction maintained. SINUSES/ORBITS: The mastoid air-cells and included paranasal sinuses are well-aerated.The included ocular globes and orbital contents are non-suspicious. OTHER: None. MRI CERVICAL SPINE FINDINGS ALIGNMENT: Maintained cervical lordosis.  No malalignment. VERTEBRAE/DISCS: Vertebral bodies are intact. Intervertebral disc morphology's and signal are normal. No abnormal or acute bone marrow signal. No abnormal osseous or disc enhancement. CORD:Cervical spinal cord is normal morphology and signal characteristics. No abnormal cord, leptomeningeal or epidural enhancement. POSTERIOR FOSSA, VERTEBRAL ARTERIES, PARASPINAL TISSUES: No MR findings of ligamentous injury. Vertebral artery flow voids present. Included posterior fossa and paraspinal soft tissues are normal. DISC LEVELS: C2-3 through C7-T1: No disc bulge, canal stenosis nor neural foraminal narrowing. MRI THORACIC SPINE FINDINGS ALIGNMENT: Maintenance of the thoracic kyphosis. No malalignment. VERTEBRAE/DISCS: Vertebral bodies are intact. Intervertebral discs morphology and signal are normal. T1 and T2 bright benign hemangioma T8. Old T11 inferior endplate Schmorl's node. No abnormal or acute bone marrow signal. No abnormal osseous or disc enhancement. CORD: Thoracic spinal cord is normal morphology and signal characteristics. No abnormal cord, LEFT-dominant or epidural enhancement. PREVERTEBRAL AND PARASPINAL SOFT TISSUES:  Normal. DISC LEVELS: Small T7-8 RIGHT central disc protrusion contacting the ventral spinal cord with  mild cord deformity, enhancing annular fissure. No canal stenosis or neural foraminal narrowing at any level. MRI LUMBAR SPINE FINDINGS SEGMENTATION: For the purposes of this report, the last well-formed intervertebral disc is reported as L5-S1. transitional anatomy with small S1-2 disc. ALIGNMENT: Maintained lumbar lordosis. No malalignment. Mild lower lumbar dextroscoliosis inferred on axial sequences. VERTEBRAE:Vertebral bodies are intact. Chronic LEFT S1 pars interarticularis defect. Intervertebral discs demonstrate normal morphology, mild desiccation L5-S1 disc. Mild chronic discogenic endplate changes I6-E7. Scattered old Schmorl's nodes. No abnormal or acute bone marrow signal. No abnormal osseous or disc enhancement. CONUS MEDULLARIS AND CAUDA EQUINA: Conus medullaris terminates at L1-2 and demonstrates normal morphology and signal characteristics. Cauda equina is normal. No abnormal cord, LEFT-dominant or epidural enhancement. PARASPINAL AND OTHER SOFT TISSUES: Included prevertebral and paraspinal soft tissues are normal. DISC LEVELS: L1-2 through L4-5: No disc bulge, canal stenosis nor neural foraminal narrowing. L5-S1: 4 mm central disc protrusion with enhancing annular fissure without traversing S1 impingement. Mild facet arthropathy. No canal stenosis or neural foraminal narrowing. IMPRESSION: MRI head: 1. Normal MRI of the head with and without contrast. MRI cervical spine: 1. Normal MRI of the cervical spine with and without contrast. MRI thoracic spine: 1. Small T7-8  disc protrusion contacting and deforming the ventral spinal cord. 2. Otherwise negative MRI of the thoracic spine with and without contrast. MRI lumbar spine: 1. L5-S1 disc protrusion and annular fissure. 2. Otherwise negative MRI of the lumbar spine with and without contrast. Electronically Signed   By: Elon Alas M.D.   On: 12/16/2017 21:23   Mr Cervical Spine W Or Wo Contrast  Result Date: 12/16/2017 CLINICAL DATA:  Acute  onset cold sensation LEFT foot while watching television this afternoon, persistent foot numbness. EXAM: MRI HEAD WITHOUT AND WITH CONTRAST MRI CERVICAL SPINE WITHOUT AND WITH CONTRAST MRI THORACIC AND LUMBAR SPINE WITHOUT AND WITH CONTRAST TECHNIQUE: Multiplanar, multiecho pulse sequences of the brain and surrounding structures, and cervical spine, to include the craniocervical junction and cervicothoracic junction, were obtained without and with intravenous contrast. Multiplanar and multiecho pulse sequences of the thoracic and lumbar spine were obtained without and with intravenous contrast. CONTRAST:  66mL MULTIHANCE GADOBENATE DIMEGLUMINE 529 MG/ML IV SOLN COMPARISON:  CT HEAD Sep 17, 2016 FINDINGS: MRI BRAIN FINDINGS INTRACRANIAL CONTENTS: No reduced diffusion to suggest acute ischemia or hyperacute demyelination. No susceptibility artifact to suggest hemorrhage. The ventricles and sulci are normal for patient's age. No suspicious parenchymal signal, masses, mass effect. No abnormal intraparenchymal or extra-axial enhancement. No abnormal extra-axial fluid collections. No extra-axial masses. VASCULAR: Normal major intracranial vascular flow voids present at skull base. SKULL AND UPPER CERVICAL SPINE: No abnormal sellar expansion. No suspicious calvarial bone marrow signal. Craniocervical junction maintained. SINUSES/ORBITS: The mastoid air-cells and included paranasal sinuses are well-aerated.The included ocular globes and orbital contents are non-suspicious. OTHER: None. MRI CERVICAL SPINE FINDINGS ALIGNMENT: Maintained cervical lordosis.  No malalignment. VERTEBRAE/DISCS: Vertebral bodies are intact. Intervertebral disc morphology's and signal are normal. No abnormal or acute bone marrow signal. No abnormal osseous or disc enhancement. CORD:Cervical spinal cord is normal morphology and signal characteristics. No abnormal cord, leptomeningeal or epidural enhancement. POSTERIOR FOSSA, VERTEBRAL ARTERIES,  PARASPINAL TISSUES: No MR findings of ligamentous injury. Vertebral artery flow voids present. Included posterior fossa and paraspinal soft tissues are normal. DISC LEVELS: C2-3 through C7-T1: No disc bulge, canal stenosis nor neural foraminal narrowing. MRI THORACIC SPINE FINDINGS ALIGNMENT: Maintenance of the thoracic kyphosis. No malalignment. VERTEBRAE/DISCS: Vertebral bodies are intact. Intervertebral discs morphology and signal are normal. T1 and T2 bright benign hemangioma T8. Old T11 inferior endplate Schmorl's node. No abnormal or acute bone marrow signal. No abnormal osseous or disc enhancement. CORD: Thoracic spinal cord is normal morphology and signal characteristics. No abnormal cord, LEFT-dominant or epidural enhancement. PREVERTEBRAL AND PARASPINAL SOFT TISSUES:  Normal. DISC LEVELS: Small T7-8 RIGHT central disc protrusion contacting the ventral spinal cord with mild cord deformity, enhancing annular fissure. No canal stenosis or neural foraminal narrowing at any level. MRI LUMBAR SPINE FINDINGS SEGMENTATION: For the purposes of this report, the last well-formed intervertebral disc is reported as L5-S1. transitional anatomy with small S1-2 disc. ALIGNMENT: Maintained lumbar lordosis. No malalignment. Mild lower lumbar dextroscoliosis inferred on axial sequences. VERTEBRAE:Vertebral bodies are intact. Chronic LEFT S1 pars interarticularis defect. Intervertebral discs demonstrate normal morphology, mild desiccation L5-S1 disc. Mild chronic discogenic endplate changes Q2-V9. Scattered old Schmorl's nodes. No abnormal or acute bone marrow signal. No abnormal osseous or disc enhancement. CONUS MEDULLARIS AND CAUDA EQUINA: Conus medullaris terminates at L1-2 and demonstrates normal morphology and signal characteristics. Cauda equina is normal. No abnormal cord, LEFT-dominant or epidural enhancement. PARASPINAL AND OTHER SOFT TISSUES: Included prevertebral and paraspinal soft tissues are normal. DISC  LEVELS: L1-2  through L4-5: No disc bulge, canal stenosis nor neural foraminal narrowing. L5-S1: 4 mm central disc protrusion with enhancing annular fissure without traversing S1 impingement. Mild facet arthropathy. No canal stenosis or neural foraminal narrowing. IMPRESSION: MRI head: 1. Normal MRI of the head with and without contrast. MRI cervical spine: 1. Normal MRI of the cervical spine with and without contrast. MRI thoracic spine: 1. Small T7-8 disc protrusion contacting and deforming the ventral spinal cord. 2. Otherwise negative MRI of the thoracic spine with and without contrast. MRI lumbar spine: 1. L5-S1 disc protrusion and annular fissure. 2. Otherwise negative MRI of the lumbar spine with and without contrast. Electronically Signed   By: Elon Alas M.D.   On: 12/16/2017 21:23   Mr Thoracic Spine W Wo Contrast  Result Date: 12/16/2017 CLINICAL DATA:  Acute onset cold sensation LEFT foot while watching television this afternoon, persistent foot numbness. EXAM: MRI HEAD WITHOUT AND WITH CONTRAST MRI CERVICAL SPINE WITHOUT AND WITH CONTRAST MRI THORACIC AND LUMBAR SPINE WITHOUT AND WITH CONTRAST TECHNIQUE: Multiplanar, multiecho pulse sequences of the brain and surrounding structures, and cervical spine, to include the craniocervical junction and cervicothoracic junction, were obtained without and with intravenous contrast. Multiplanar and multiecho pulse sequences of the thoracic and lumbar spine were obtained without and with intravenous contrast. CONTRAST:  56mL MULTIHANCE GADOBENATE DIMEGLUMINE 529 MG/ML IV SOLN COMPARISON:  CT HEAD Sep 17, 2016 FINDINGS: MRI BRAIN FINDINGS INTRACRANIAL CONTENTS: No reduced diffusion to suggest acute ischemia or hyperacute demyelination. No susceptibility artifact to suggest hemorrhage. The ventricles and sulci are normal for patient's age. No suspicious parenchymal signal, masses, mass effect. No abnormal intraparenchymal or extra-axial enhancement. No  abnormal extra-axial fluid collections. No extra-axial masses. VASCULAR: Normal major intracranial vascular flow voids present at skull base. SKULL AND UPPER CERVICAL SPINE: No abnormal sellar expansion. No suspicious calvarial bone marrow signal. Craniocervical junction maintained. SINUSES/ORBITS: The mastoid air-cells and included paranasal sinuses are well-aerated.The included ocular globes and orbital contents are non-suspicious. OTHER: None. MRI CERVICAL SPINE FINDINGS ALIGNMENT: Maintained cervical lordosis.  No malalignment. VERTEBRAE/DISCS: Vertebral bodies are intact. Intervertebral disc morphology's and signal are normal. No abnormal or acute bone marrow signal. No abnormal osseous or disc enhancement. CORD:Cervical spinal cord is normal morphology and signal characteristics. No abnormal cord, leptomeningeal or epidural enhancement. POSTERIOR FOSSA, VERTEBRAL ARTERIES, PARASPINAL TISSUES: No MR findings of ligamentous injury. Vertebral artery flow voids present. Included posterior fossa and paraspinal soft tissues are normal. DISC LEVELS: C2-3 through C7-T1: No disc bulge, canal stenosis nor neural foraminal narrowing. MRI THORACIC SPINE FINDINGS ALIGNMENT: Maintenance of the thoracic kyphosis. No malalignment. VERTEBRAE/DISCS: Vertebral bodies are intact. Intervertebral discs morphology and signal are normal. T1 and T2 bright benign hemangioma T8. Old T11 inferior endplate Schmorl's node. No abnormal or acute bone marrow signal. No abnormal osseous or disc enhancement. CORD: Thoracic spinal cord is normal morphology and signal characteristics. No abnormal cord, LEFT-dominant or epidural enhancement. PREVERTEBRAL AND PARASPINAL SOFT TISSUES:  Normal. DISC LEVELS: Small T7-8 RIGHT central disc protrusion contacting the ventral spinal cord with mild cord deformity, enhancing annular fissure. No canal stenosis or neural foraminal narrowing at any level. MRI LUMBAR SPINE FINDINGS SEGMENTATION: For the purposes  of this report, the last well-formed intervertebral disc is reported as L5-S1. transitional anatomy with small S1-2 disc. ALIGNMENT: Maintained lumbar lordosis. No malalignment. Mild lower lumbar dextroscoliosis inferred on axial sequences. VERTEBRAE:Vertebral bodies are intact. Chronic LEFT S1 pars interarticularis defect. Intervertebral discs demonstrate normal morphology, mild desiccation L5-S1 disc. Mild  chronic discogenic endplate changes T2-I7. Scattered old Schmorl's nodes. No abnormal or acute bone marrow signal. No abnormal osseous or disc enhancement. CONUS MEDULLARIS AND CAUDA EQUINA: Conus medullaris terminates at L1-2 and demonstrates normal morphology and signal characteristics. Cauda equina is normal. No abnormal cord, LEFT-dominant or epidural enhancement. PARASPINAL AND OTHER SOFT TISSUES: Included prevertebral and paraspinal soft tissues are normal. DISC LEVELS: L1-2 through L4-5: No disc bulge, canal stenosis nor neural foraminal narrowing. L5-S1: 4 mm central disc protrusion with enhancing annular fissure without traversing S1 impingement. Mild facet arthropathy. No canal stenosis or neural foraminal narrowing. IMPRESSION: MRI head: 1. Normal MRI of the head with and without contrast. MRI cervical spine: 1. Normal MRI of the cervical spine with and without contrast. MRI thoracic spine: 1. Small T7-8 disc protrusion contacting and deforming the ventral spinal cord. 2. Otherwise negative MRI of the thoracic spine with and without contrast. MRI lumbar spine: 1. L5-S1 disc protrusion and annular fissure. 2. Otherwise negative MRI of the lumbar spine with and without contrast. Electronically Signed   By: Elon Alas M.D.   On: 12/16/2017 21:23   Mr Lumbar Spine W Wo Contrast  Result Date: 12/16/2017 CLINICAL DATA:  Acute onset cold sensation LEFT foot while watching television this afternoon, persistent foot numbness. EXAM: MRI HEAD WITHOUT AND WITH CONTRAST MRI CERVICAL SPINE WITHOUT AND  WITH CONTRAST MRI THORACIC AND LUMBAR SPINE WITHOUT AND WITH CONTRAST TECHNIQUE: Multiplanar, multiecho pulse sequences of the brain and surrounding structures, and cervical spine, to include the craniocervical junction and cervicothoracic junction, were obtained without and with intravenous contrast. Multiplanar and multiecho pulse sequences of the thoracic and lumbar spine were obtained without and with intravenous contrast. CONTRAST:  1mL MULTIHANCE GADOBENATE DIMEGLUMINE 529 MG/ML IV SOLN COMPARISON:  CT HEAD Sep 17, 2016 FINDINGS: MRI BRAIN FINDINGS INTRACRANIAL CONTENTS: No reduced diffusion to suggest acute ischemia or hyperacute demyelination. No susceptibility artifact to suggest hemorrhage. The ventricles and sulci are normal for patient's age. No suspicious parenchymal signal, masses, mass effect. No abnormal intraparenchymal or extra-axial enhancement. No abnormal extra-axial fluid collections. No extra-axial masses. VASCULAR: Normal major intracranial vascular flow voids present at skull base. SKULL AND UPPER CERVICAL SPINE: No abnormal sellar expansion. No suspicious calvarial bone marrow signal. Craniocervical junction maintained. SINUSES/ORBITS: The mastoid air-cells and included paranasal sinuses are well-aerated.The included ocular globes and orbital contents are non-suspicious. OTHER: None. MRI CERVICAL SPINE FINDINGS ALIGNMENT: Maintained cervical lordosis.  No malalignment. VERTEBRAE/DISCS: Vertebral bodies are intact. Intervertebral disc morphology's and signal are normal. No abnormal or acute bone marrow signal. No abnormal osseous or disc enhancement. CORD:Cervical spinal cord is normal morphology and signal characteristics. No abnormal cord, leptomeningeal or epidural enhancement. POSTERIOR FOSSA, VERTEBRAL ARTERIES, PARASPINAL TISSUES: No MR findings of ligamentous injury. Vertebral artery flow voids present. Included posterior fossa and paraspinal soft tissues are normal. DISC LEVELS: C2-3  through C7-T1: No disc bulge, canal stenosis nor neural foraminal narrowing. MRI THORACIC SPINE FINDINGS ALIGNMENT: Maintenance of the thoracic kyphosis. No malalignment. VERTEBRAE/DISCS: Vertebral bodies are intact. Intervertebral discs morphology and signal are normal. T1 and T2 bright benign hemangioma T8. Old T11 inferior endplate Schmorl's node. No abnormal or acute bone marrow signal. No abnormal osseous or disc enhancement. CORD: Thoracic spinal cord is normal morphology and signal characteristics. No abnormal cord, LEFT-dominant or epidural enhancement. PREVERTEBRAL AND PARASPINAL SOFT TISSUES:  Normal. DISC LEVELS: Small T7-8 RIGHT central disc protrusion contacting the ventral spinal cord with mild cord deformity, enhancing annular fissure. No canal stenosis or  neural foraminal narrowing at any level. MRI LUMBAR SPINE FINDINGS SEGMENTATION: For the purposes of this report, the last well-formed intervertebral disc is reported as L5-S1. transitional anatomy with small S1-2 disc. ALIGNMENT: Maintained lumbar lordosis. No malalignment. Mild lower lumbar dextroscoliosis inferred on axial sequences. VERTEBRAE:Vertebral bodies are intact. Chronic LEFT S1 pars interarticularis defect. Intervertebral discs demonstrate normal morphology, mild desiccation L5-S1 disc. Mild chronic discogenic endplate changes Z3-G9. Scattered old Schmorl's nodes. No abnormal or acute bone marrow signal. No abnormal osseous or disc enhancement. CONUS MEDULLARIS AND CAUDA EQUINA: Conus medullaris terminates at L1-2 and demonstrates normal morphology and signal characteristics. Cauda equina is normal. No abnormal cord, LEFT-dominant or epidural enhancement. PARASPINAL AND OTHER SOFT TISSUES: Included prevertebral and paraspinal soft tissues are normal. DISC LEVELS: L1-2 through L4-5: No disc bulge, canal stenosis nor neural foraminal narrowing. L5-S1: 4 mm central disc protrusion with enhancing annular fissure without traversing S1  impingement. Mild facet arthropathy. No canal stenosis or neural foraminal narrowing. IMPRESSION: MRI head: 1. Normal MRI of the head with and without contrast. MRI cervical spine: 1. Normal MRI of the cervical spine with and without contrast. MRI thoracic spine: 1. Small T7-8 disc protrusion contacting and deforming the ventral spinal cord. 2. Otherwise negative MRI of the thoracic spine with and without contrast. MRI lumbar spine: 1. L5-S1 disc protrusion and annular fissure. 2. Otherwise negative MRI of the lumbar spine with and without contrast. Electronically Signed   By: Elon Alas M.D.   On: 12/16/2017 21:23    Procedures Procedures (including critical care time)  Medications Ordered in ED Medications  acetaminophen (TYLENOL) tablet 650 mg (has no administration in time range)    Or  acetaminophen (TYLENOL) suppository 650 mg (has no administration in time range)  ondansetron (ZOFRAN) tablet 4 mg (has no administration in time range)    Or  ondansetron (ZOFRAN) injection 4 mg (has no administration in time range)  gadobenate dimeglumine (MULTIHANCE) injection 18 mL (18 mLs Intravenous Contrast Given 12/16/17 2052)     Initial Impression / Assessment and Plan / ED Course  I have reviewed the triage vital signs and the nursing notes.  Pertinent labs & imaging results that were available during my care of the patient were reviewed by me and considered in my medical decision making (see chart for details).  Patient presents for evaluation of numbness and inability to move the left foot, he felt this was cool to the touch as well.  Patient was seen for similar symptoms 4 days ago, was evaluated by vascular surgery and this was thought to be more of a neurologic syndrome.  Patient with normal vitals.  On exam left foot does seem cooler to the touch but he has strong 2+ pulses which are equal in bilateral lower extremities, this was confirmed with Doppler.  Patient has no sensation to  soft touch over the left foot and is unable to range the ankle or toes, sensation intact above the ankle.  Symptoms is not in line with a single dermatome and is very atypical.  With strong pulses I do doubt that this is a vascular issue and more so neurologic.  Patient had a reported some palpitations, but had any reassuring EKG with no evidence of A. fib during last evaluation to suggest thromboembolic event.  Will consult neurology for imaging recommendations.  Case discussed with Dr. Leonel Ramsay with neurology, who agrees presentation is on and not typical of any obvious neurologic syndrome.  Recommends MRI of the head, cervical  spine, thoracic spine and lumbar spine, given symptoms if there is a central lesion that is likely above the lumbar spine.  MRI is ordered and plan discussed with the patient.  Patient reports numbness seems to be spreading up position currently, he reports until today he had no migration of the symptoms.  Discussed again with Dr. Leonel Ramsay who recommends continuing with plan for MRIs of brain and spine.  Return from MRI and now reports numbness has extended all the way up the left leg and he is unable to move it.  Patient continues to have strong 2+ pulse and normal coloration of the leg.  MRI of the brain is unremarkable, MRI of the cervical spine shows no acute cause for patient's symptoms.  MR of the thoracic spine shows a disc protrusion at T7-8 with some possible vertebral canal disruption, but this would not account for patient's symptoms, lumbar MRI is unremarkable as well.  Discussed these imaging results with Dr. Lorraine Lax with neurology, who reviewed imaging as well and feels that there is no obvious organic cause on MRI for patient's symptoms.  Dr. Lorraine Lax evaluated and examined the patient, who now reports that numbness has progressed up to his back and he has some tingling in his left hand.  After thorough physical examination Dr. Malen Gauze feels this is likely more of a  functional/psychogenic disorder as with distraction, patient does seem to use the leg slightly and he remains with good tone and reflexes intact, this is not concerning for an atypical presentation of Iline Oven given intact reflexes and normal muscle tone.  Despite that this is likely a functional disorder patient is unable to walk and neurology recommends admission with PT and OT evaluation.  Discussed the use of benzodiazepines to potentially help with symptoms and/or anxiety, patient vehemently denies being anxious at all, although he appears anxious on exam, and declines these medications at this time.  Consult placed to hospitalist for admission.  Case discussed with Dr. Hal Hope with [facility] hospitalist who will see and admit the patient.  Final Clinical Impressions(s) / ED Diagnoses   Final diagnoses:  Functional weakness  Numbness    ED Discharge Orders    None       Jacqlyn Larsen, [place] 12/17/17 0200    Sherwood Gambler, MD 12/18/17 1745

## 2017-12-16 NOTE — ED Triage Notes (Signed)
Pt. Stated, I started having palpitation on Thursday and then my foot started going to sleep and I couldn't move it. The Dr. Rockey Situ me we are putting you in this boot you might have foot drop. Here today because my foot became really white and cold. Soaked it in warm water for 30 min. And it was still cold. And told if any changes to come here.

## 2017-12-16 NOTE — ED Notes (Signed)
Pedal pulse confirmed w/ doppler

## 2017-12-16 NOTE — ED Notes (Addendum)
Pt states that he is losing feeling in his L leg; Pedal pulse still present; appearance unchanged from first assessment; MD aware

## 2017-12-16 NOTE — ED Provider Notes (Signed)
Patient placed in Quick Look pathway, seen and evaluated   Chief Complaint: Left foot cool to touch  HPI:   Patient presents complaining of acute onset, constant cold sensation to the left foot.  He states this began while he was watching television at around 1 PM.  He states that he noticed that his left foot was entirely white in color which did not improve when he stood up.  He states that he soaks the foot in warm water for 30 minutes without improvement.  He was seen and evaluated on 12/12/2017 for similar symptoms and vascular surgery was consulted.  They did not feel that his symptoms are vascular in etiology but recommended follow-up with neurology.  He has an appointment scheduled on September 11 with neurology.  He states that his left foot has been completely numb since that time and he has not been able to move it at all since then.  Denies back pain, fevers, urinary incontinence.  ROS: Positive for color change, numbness, weakness Negative for fevers, incontinence, back pain  Physical Exam:   Gen: No distress  Neuro: Awake and Alert  Skin: Warm    Focused Exam: 2+ DP/PT pulses bilaterally, no lower extremity edema.  Cap refill less than 3 seconds bilaterally.  No sensation to soft touch of the left foot circumferentially.  Patient will not range the left ankle or toes.   Initiation of care has begun. The patient has been counseled on the process, plan, and necessity for staying for the completion/evaluation, and the remainder of the medical screening examination    Renita Papa, PA-C 12/16/17 1430    Lajean Saver, MD 12/16/17 1504

## 2017-12-17 ENCOUNTER — Other Ambulatory Visit: Payer: Self-pay

## 2017-12-17 ENCOUNTER — Encounter (HOSPITAL_COMMUNITY): Payer: Self-pay | Admitting: Internal Medicine

## 2017-12-17 DIAGNOSIS — R29898 Other symptoms and signs involving the musculoskeletal system: Secondary | ICD-10-CM | POA: Diagnosis present

## 2017-12-17 HISTORY — DX: Other symptoms and signs involving the musculoskeletal system: R29.898

## 2017-12-17 LAB — CBC
HCT: 49 % (ref 39.0–52.0)
HCT: 48.5 % (ref 39.0–52.0)
Hemoglobin: 17.4 g/dL — ABNORMAL HIGH (ref 13.0–17.0)
Hemoglobin: 17.4 g/dL — ABNORMAL HIGH (ref 13.0–17.0)
MCH: 29.9 pg (ref 26.0–34.0)
MCH: 30.2 pg (ref 26.0–34.0)
MCHC: 35.5 g/dL (ref 30.0–36.0)
MCHC: 35.9 g/dL (ref 30.0–36.0)
MCV: 84.3 fL (ref 78.0–100.0)
MCV: 84.2 fL (ref 78.0–100.0)
Platelets: 246 10*3/uL (ref 150–400)
Platelets: 249 10*3/uL (ref 150–400)
RBC: 5.81 MIL/uL (ref 4.22–5.81)
RBC: 5.76 MIL/uL (ref 4.22–5.81)
RDW: 11.9 % (ref 11.5–15.5)
RDW: 12 % (ref 11.5–15.5)
WBC: 8.9 10*3/uL (ref 4.0–10.5)
WBC: 9.8 10*3/uL (ref 4.0–10.5)

## 2017-12-17 LAB — BASIC METABOLIC PANEL
Anion gap: 11 (ref 5–15)
Anion gap: 11 (ref 5–15)
BUN: 10 mg/dL (ref 6–20)
BUN: 10 mg/dL (ref 6–20)
CALCIUM: 9.2 mg/dL (ref 8.9–10.3)
CHLORIDE: 104 mmol/L (ref 98–111)
CO2: 22 mmol/L (ref 22–32)
CO2: 23 mmol/L (ref 22–32)
CREATININE: 0.91 mg/dL (ref 0.61–1.24)
Calcium: 9.2 mg/dL (ref 8.9–10.3)
Chloride: 104 mmol/L (ref 98–111)
Creatinine, Ser: 1.01 mg/dL (ref 0.61–1.24)
GFR calc Af Amer: >60 mL/min (ref >60)
GFR calc Af Amer: >60 mL/min (ref >60)
GFR calc non Af Amer: >60 mL/min (ref >60)
Glucose, Bld: 92 mg/dL (ref 70–99)
Glucose, Bld: 97 mg/dL (ref 70–99)
Potassium: 3.5 mmol/L (ref 3.5–5.1)
Potassium: 4 mmol/L (ref 3.5–5.1)
SODIUM: 137 mmol/L (ref 135–145)
SODIUM: 138 mmol/L (ref 135–145)

## 2017-12-17 LAB — TSH: TSH: 2.972 u[IU]/mL (ref 0.350–4.500)

## 2017-12-17 LAB — HIV ANTIBODY (ROUTINE TESTING W REFLEX): HIV SCREEN 4TH GENERATION: NONREACTIVE

## 2017-12-17 MED ORDER — ONDANSETRON HCL 4 MG/2ML IJ SOLN: 4.0000 mg | Freq: Four times a day (QID) | INTRAMUSCULAR | Status: DC | PRN

## 2017-12-17 MED ORDER — ACETAMINOPHEN 325 MG PO TABS
650.0000 mg | ORAL_TABLET | Freq: Four times a day (QID) | ORAL | Status: DC | PRN
  Administered 2017-12-17 (×2): 650 mg via ORAL
  Filled 2017-12-17 (×3): qty 2

## 2017-12-17 MED ORDER — LORAZEPAM 1 MG PO TABS
1.0000 mg | ORAL_TABLET | Freq: Once | ORAL | Status: DC
Start: 2017-12-17 — End: 2017-12-17

## 2017-12-17 MED ORDER — ACETAMINOPHEN 650 MG RE SUPP: 650.0000 mg | Freq: Four times a day (QID) | RECTAL | Status: DC | PRN

## 2017-12-17 MED ORDER — ONDANSETRON HCL 4 MG PO TABS: 4.0000 mg | ORAL_TABLET | Freq: Four times a day (QID) | ORAL | Status: DC | PRN

## 2017-12-17 MED ORDER — SODIUM CHLORIDE 0.9 % IV SOLN
INTRAVENOUS | Status: AC
  Administered 2017-12-17: 14:00:00 via INTRAVENOUS

## 2017-12-17 NOTE — Progress Notes (Signed)
Subjective: Patient admitted this morning, see detailed H&P by Dr Hal Hope 27 year old male with no medical history came with complaints of increasing left lower extremity weakness.  He was seen by vascular surgeon and was cleared by surgery and he did not have anything acute.  Patient says the symptoms have persisted for the past 24 hours.  He was also seen by neurology and was diagnosed with conversion disorder.  MRI of the thoracic lumbar spine and MRI brain did not show any abnormality which could explain patient's symptoms.  Patient also complained of urinary retention and was able to hold urine, and did urinate on his own while he was going to get straight cath.  Vitals:   12/17/17 0404 12/17/17 1543  BP: 121/80 125/81  Pulse: (!) 59 77  Resp: 17 19  Temp: 97.7 F (36.5 C) (!) 97.2 F (36.2 C)  SpO2: 98%       A/P Conversion disorder PT recommended outpatient PT Patient does not want psychiatric evaluation at this time.    [place] Hospitalist [phone]

## 2017-12-17 NOTE — Plan of Care (Signed)
  Problem: Activity: Goal: Risk for activity intolerance will decrease Outcome: Progressing   Problem: Nutrition: Goal: Adequate nutrition will be maintained Outcome: Progressing   Problem: Coping: Goal: Level of anxiety will decrease Outcome: Progressing   Problem: Elimination: Goal: Will not experience complications related to bowel motility Outcome: Progressing   Problem: Safety: Goal: Ability to remain free from injury will improve Outcome: Progressing   

## 2017-12-17 NOTE — Evaluation (Signed)
Physical Therapy Evaluation Patient Details Name: Ian Lewis MRN: [id number] DOB: 01/03/1991 Today's Date: 12/17/2017   History of Present Illness  Pt is a 27 y/o male admitted secondary to L sided weakness and numbness. Neuro was consulted and all imagining thus far has been negative. Potential psych consult. No pertinent PMH.    Clinical Impression  Pt presented supine in bed with HOB elevated, awake and willing to participate in therapy session. Prior to admission, pt reported that he has been using crutches to ambulate since initial onset of L foot numbness/weakness. Pt currently able to perform bed mobility with supervision, transfers with min guard and ambulate a short distance with RW and min guard. Pt with inconsistent presentation throughout with L LE strength testing and functional assessment. Pt on RA throughout with SPO2 in high 90's and HR in mid 70's despite pt with reported feelings of "palpitations". Pt would continue to benefit from skilled physical therapy services at this time while admitted and after d/c to address the below listed limitations in order to improve overall safety and independence with functional mobility.     Follow Up Recommendations Outpatient PT    Equipment Recommendations  None recommended by PT    Recommendations for Other Services       Precautions / Restrictions Precautions Precautions: Fall Restrictions Weight Bearing Restrictions: No      Mobility  Bed Mobility Overal bed mobility: Needs Assistance Bed Mobility: Supine to Sit     Supine to sit: Supervision     General bed mobility comments: increased time and effort, pt using bilateral UEs to assist L LE movement off of bed  Transfers Overall transfer level: Needs assistance Equipment used: Rolling walker (2 wheeled) Transfers: Sit to/from Stand Sit to Stand: Min guard         General transfer comment: increased time, cueing for safe hand placement and min guard for  safety  Ambulation/Gait Ambulation/Gait assistance: Min guard Gait Distance (Feet): 10 Feet(10' x1 with sitting rest break and then 15' x1) Assistive device: Rolling walker (2 wheeled) Gait Pattern/deviations: Step-to pattern;Step-through pattern;Decreased step length - right;Decreased step length - left;Decreased stride length;Trendelenburg Gait velocity: decreased Gait velocity interpretation: <1.31 ft/sec, indicative of household ambulator General Gait Details: pt with heavy R lateral weight shift to unload L LE to be able to slide it forwards to advance gait. Pt with zero foot clearance on L; however, was able to fully weight bear on L LE without any knee buckling or instability  Stairs            Wheelchair Mobility    Modified Rankin (Stroke Patients Only)       Balance Overall balance assessment: Needs assistance Sitting-balance support: Feet supported Sitting balance-Leahy Scale: Good     Standing balance support: No upper extremity supported Standing balance-Leahy Scale: Fair Standing balance comment: static standing fair without UE supports; dynamic requiring bilateral UEs                             Pertinent Vitals/Pain Pain Assessment: No/denies pain    Home Living Family/patient expects to be discharged to:: Private residence Living Arrangements: Parent Available Help at Discharge: Family;Available 24 hours/day Type of Home: House Home Access: Stairs to enter Entrance Stairs-Rails: None Entrance Stairs-Number of Steps: 4 Home Layout: Two level;Bed/bath upstairs Home Equipment: Crutches      Prior Function Level of Independence: Independent with assistive device(s)  Comments: was walking with crutches     Hand Dominance   Dominant Hand: Left    Extremity/Trunk Assessment   Upper Extremity Assessment Upper Extremity Assessment: Overall WFL for tasks assessed;Defer to OT evaluation    Lower Extremity Assessment Lower  Extremity Assessment: LLE deficits/detail LLE Deficits / Details: pt with inconsistent presentation with MMT versus functional tasks. Uncertain of an accurate strength assessment. During static standing, pt able to fully weight bear through L LE with all of his body weight on L LE with no issues; however, when asked to advance L LE forwards for gait, pt laterally weight shifting R to unload L LE and sliding L foot forwards on floor with zero foot clearance LLE Sensation: decreased light touch    Cervical / Trunk Assessment Cervical / Trunk Assessment: Normal  Communication      Cognition Arousal/Alertness: Awake/alert Behavior During Therapy: WFL for tasks assessed/performed Overall Cognitive Status: Within Functional Limits for tasks assessed                                        General Comments      Exercises     Assessment/Plan    PT Assessment Patient needs continued PT services  PT Problem List Decreased balance;Decreased mobility;Decreased coordination;Decreased knowledge of use of DME;Decreased safety awareness;Impaired sensation       PT Treatment Interventions DME instruction;Gait training;Stair training;Functional mobility training;Therapeutic activities;Therapeutic exercise;Balance training;Neuromuscular re-education;Patient/family education    PT Goals (Current goals can be found in the Care Plan section)  Acute Rehab PT Goals Patient Stated Goal: figure out what is going on PT Goal Formulation: With patient Time For Goal Achievement: 12/31/17 Potential to Achieve Goals: Good    Frequency Min 3X/week   Barriers to discharge        Co-evaluation               AM-PAC PT "6 Clicks" Daily Activity  Outcome Measure Difficulty turning over in bed (including adjusting bedclothes, sheets and blankets)?: A Lot Difficulty moving from lying on back to sitting on the side of the bed? : A Lot Difficulty sitting down on and standing up from a  chair with arms (e.g., wheelchair, bedside commode, etc,.)?: Unable Help needed moving to and from a bed to chair (including a wheelchair)?: A Little Help needed walking in hospital room?: A Lot Help needed climbing 3-5 steps with a railing? : A Lot 6 Click Score: 12    End of Session Equipment Utilized During Treatment: Gait belt Activity Tolerance: Patient tolerated treatment well Patient left: in chair;with call bell/phone within reach;with family/visitor present Nurse Communication: Mobility status PT Visit Diagnosis: Other abnormalities of gait and mobility (R26.89);Muscle weakness (generalized) (M62.81)    Time: 9163-8466 PT Time Calculation (min) (ACUTE ONLY): 39 min   Charges:   PT Evaluation $PT Eval Moderate Complexity: 1 Mod PT Treatments $Therapeutic Activity: 23-37 mins        [place], [place], DPT [place] 12/17/2017, 11:21 AM

## 2017-12-17 NOTE — Progress Notes (Signed)
Occupational Therapy Treatment Patient Details Name: Ian Lewis MRN: [id number] DOB: 1990-11-26 Today's Date: 12/17/2017    History of present illness  27 y/o male admitted secondary to L sided weakness and numbness. Neuro was consulted and all imagining thus far has been negative. Potential psych consult. No pertinent PMH.   OT comments  Patient evaluated by Occupational Therapy with no further acute OT needs identified. All education has been completed and the patient has no further questions. See below for any follow-up Occupational Therapy or equipment needs. OT to sign off. Thank you for referral.   Pt does have L LE weakness and inability to void bladder. Pt transferred to bathroom with running water and static standing with inability to void bladder. Rn educated on inability to void during session. Pt denies any stressors in his life at this time. From OT stand point pt is adequate level to complete adls mod I but could benefit from PT for L LE ankle deficits.     Follow Up Recommendations  PT follow up   Equipment Recommendations  PT outpatient    Recommendations for Other Services      Precautions / Restrictions Precautions Precautions: Fall Restrictions Weight Bearing Restrictions: No       Mobility Bed Mobility Overal bed mobility: Modified Independent Bed Mobility: Supine to Sit           General bed mobility comments: hob elevated   Transfers Overall transfer level: Needs assistance Equipment used: Rolling walker (2 wheeled) Transfers: Sit to/from Stand Sit to Stand: Supervision         General transfer comment: pt able to power up and progress to toilet transfer static standing unable to void    Balance Overall balance assessment: Needs assistance Sitting-balance support: Feet supported Sitting balance-Leahy Scale: Good                                     ADL either performed or assessed with clinical judgement   ADL  Overall ADL's : Modified independent                                       General ADL Comments: pt able to complete bed mobility , toilet transfer and use of RW. pt dragging L LE during transfers. pt denies any home life stressors. pt reports inability to void since 9 pm 12/16/17. pt seems flat affect regarding these physical changes.      Vision Baseline Vision/History: Wears glasses     Perception     Praxis      Cognition Arousal/Alertness: Awake/alert Behavior During Therapy: WFL for tasks assessed/performed Overall Cognitive Status: Within Functional Limits for tasks assessed                                          Exercises     Shoulder Instructions       General Comments      Pertinent Vitals/ Pain       Pain Assessment: Faces Faces Pain Scale: Hurts little more Pain Location: abdomen pain - across horizontal at the belly button Pain Descriptors / Indicators: Discomfort;Dull Pain Intervention(s): Monitored during session;Repositioned;Premedicated before session  Home Living Family/patient expects to be discharged to:: Private  residence Living Arrangements: Parent Available Help at Discharge: Family;Available 24 hours/day Type of Home: House Home Access: Stairs to enter [employer] of Steps: 4 Entrance Stairs-Rails: None Home Layout: Two level;Bed/bath upstairs Alternate Level Stairs-Number of Steps: 14 Alternate Level Stairs-Rails: Right;Left Bathroom Shower/Tub: Tub/shower unit         Home Equipment: Crutches          Prior Functioning/Environment Level of Independence: Independent with assistive device(s)        Comments: was walking with crutches   Frequency           Progress Toward Goals  OT Goals(current goals can now be found in the care plan section)     Acute Rehab OT Goals Patient Stated Goal: to know why i can't pee OT Goal Formulation: With patient Potential to Achieve Goals:  Good  Plan      Co-evaluation                 AM-PAC PT "6 Clicks" Daily Activity     Outcome Measure   Help from another person eating meals?: None Help from another person taking care of personal grooming?: None Help from another person toileting, which includes using toliet, bedpan, or urinal?: None Help from another person bathing (including washing, rinsing, drying)?: None Help from another person to put on and taking off regular upper body clothing?: None Help from another person to put on and taking off regular lower body clothing?: None 6 Click Score: 24    End of Session Equipment Utilized During Treatment: Gait belt;Rolling walker  OT Visit Diagnosis: Unsteadiness on feet (R26.81)   Activity Tolerance Patient tolerated treatment well   Patient Left in bed;with call bell/phone within reach   Nurse Communication Mobility status;Precautions        Time: 1455-1510 OT Time Calculation (min): 15 min  Charges: OT General Charges $OT Visit: 1 Visit OT Evaluation $OT Eval Moderate Complexity: 1 Mod   Jeri Modena   OTR/L Pager: [phone] Office: [phone] .    Parke Poisson B 12/17/2017, 3:44 PM

## 2017-12-17 NOTE — Consult Note (Signed)
Requesting Physician: Benedetto Goad PA-C    Chief Complaint: Left leg weakness  History obtained from: Patient and Chart    HPI:                                                                                                                                       Ian Lewis is an 27 y.o. male with past medical history of syncope who presents to [facility], ER with 5-day history of left foot weakness and sensation of feeling cold, now with progressive numbness and weakness of entire left leg as well as numbness extending to his left trunk.  Patient was evaluated on August 2 after he had sudden onset sensation of feeling cold in his left foot and he noticed it appeared pale.  Patient states first began with palpitations followed by feeling of coldness in his foot.  On assessment in the emergency department, vascular surgery noted patient to have strong pulses and he was discharged with a brace with outpatient neurology follow-up.  Today patient noticed that his foot felt extremely cold again compared to the right side and decided to come to the emergency room for evaluation.  While in the emergency room he noticed that numbness that was only present in his foot started to extend up to the knee.  After discussion with neurology, it was decided to obtain MRI brain as well as spinal imaging to look for's demyelinating lesions/cord compression.  While undergoing the MRI, numbness and weakness extended to involve his entire left leg.  Subsequently, the progressed to include his left arm and left trunk.  On assessment patient is saying that he is unable to lift the entire left leg.  He states he had an episode last year where he had a syncopal event followed by numbness of both feet that lasted for a week.  He underwent an EEG which was apparently normal and he had no explanation for this feet numbness.  History reviewed. No pertinent past medical history.  Past Surgical History:  Procedure Laterality  Date  . KNEE SURGERY    . MANDIBLE FRACTURE SURGERY      Family History  Problem Relation Age of Onset  . Atrial fibrillation Father    Social History:  reports that he has never smoked. He has never used smokeless tobacco. He reports that he does not drink alcohol or use drugs.  Allergies:  Allergies  Allergen Reactions  . Lorabid [Loracarbef] Hives    Medications:  I reviewed home medications.  He is not on any medications   ROS:                                                                                                                                     14 systems reviewed and negative except above    Examination:                                                                                                      General: Appears well-developed and well-nourished.  Psych: Affect appropriate to situation Eyes: No scleral injection HENT: No OP obstrucion Head: Normocephalic.  Cardiovascular: Normal rate and regular rhythm.  Respiratory: Effort normal and breath sounds normal to anterior ascultation GI: Soft.  No distension. There is no tenderness.  Skin: WDI    Neurological Examination Mental Status: Alert, oriented, thought content appropriate.  Speech fluent without evidence of aphasia. Able to follow 3 step commands without difficulty. Cranial Nerves: II: Visual fields grossly normal,  III,IV, VI: ptosis not present, extra-ocular motions intact bilaterally, pupils equal, round, reactive to light and accommodation V,VII: smile symmetric, facial light touch sensation normal bilaterally VIII: hearing normal bilaterally IX,X: uvula rises symmetrically XI: bilateral shoulder shrug XII: midline tongue extension  Mental Status: Alert, oriented, thought content appropriate.  Speech fluent without evidence of aphasia.  Able to follow 3 step commands  without difficulty. Cranial Nerves: II: visual fields grossly normal, pupils equal, round, reactive to light and accommodation III,IV, VI: ptosis not present, extraocular muscles extra-ocular motions intact bilaterally V,VII: smile symmetric, facial light touch sensation normal bilaterally VIII: hearing normal bilaterally IX,X: gag reflex present XI: trapezius strength/neck flexion strength normal bilaterally XII: tongue strength normal  Motor: Right : Upper extremity    Left:     Upper extremity 5/5 deltoid       5/5 deltoid 5/5 tricep      5/5 tricep 5/5 biceps      5/5 biceps  5/5wrist flexion     5/5 wrist flexion 5/5 wrist extension     5/5 wrist extension 5/5 hand grip      5/5 hand grip  Lower extremity     Lower extremity 5/5 hip flexor      0/5 hip flexor 5/5 hip adductors     0/5 hip adductors 5/5 hip abductors     0/5 hip abductors 5/5 quadricep      0/5 quadriceps  5/5 hamstrings     0/5 hamstrings 5/5 plantar  flexion       0/5 plantar flexion 5/5 plantar extension     0/5 plantar extension Poor effort with left leg, Hoover's sign positive.  (Also would have spontaneous movement and was able to maintain tone when asked to perform supine to sitting position) Tone and bulk:normal tone throughout; no atrophy noted Sensory: Patient appears to not feel anything at all to pinprick, light touch over the entirety of his left leg both inner and outer aspects.  Deep Tendon Reflexes:  Right: Upper Extremity   Left: Upper extremity   biceps (C-5 to C-6) 2/4   biceps (C-5 to C-6) 2/4 tricep (C7) 2/4    triceps (C7) 2/4 Brachioradialis (C6) 2/4  Brachioradialis (C6) 2/4  Lower Extremity Lower Extremity  quadriceps (L-2 to L-4) 2/4   quadriceps (L-2 to L-4) 2/4 Achilles (S1) 2/4   Achilles (S1) 2/4  Plantars: Right: downgoing   Left: downgoing Cerebellar: normal finger-to-nose Gait: Unable to assess     Lab Results: Basic Metabolic Panel: Recent Labs  Lab  12/12/17 1820 12/17/17 0104 12/17/17 0243  NA 139 137 138  K 4.2 3.5 4.0  CL 108 104 104  CO2 23 22 23   GLUCOSE 95 92 97  BUN 14 10 10   CREATININE 0.93 0.91 1.01  CALCIUM 9.0 9.2 9.2    CBC: Recent Labs  Lab 12/12/17 1820 12/17/17 0104 12/17/17 0243  WBC 8.0 8.9 9.8  HGB 17.1* 17.4* 17.4*  HCT 49.7 49.0 48.5  MCV 87.0 84.3 84.2  PLT 300 246 249    Coagulation Studies: No results for input(s): LABPROT, INR in the last 72 hours.  Imaging: Mr Jeri Cos And Wo Contrast  Result Date: 12/16/2017 CLINICAL DATA:  Acute onset cold sensation LEFT foot while watching television this afternoon, persistent foot numbness. EXAM: MRI HEAD WITHOUT AND WITH CONTRAST MRI CERVICAL SPINE WITHOUT AND WITH CONTRAST MRI THORACIC AND LUMBAR SPINE WITHOUT AND WITH CONTRAST TECHNIQUE: Multiplanar, multiecho pulse sequences of the brain and surrounding structures, and cervical spine, to include the craniocervical junction and cervicothoracic junction, were obtained without and with intravenous contrast. Multiplanar and multiecho pulse sequences of the thoracic and lumbar spine were obtained without and with intravenous contrast. CONTRAST:  62mL MULTIHANCE GADOBENATE DIMEGLUMINE 529 MG/ML IV SOLN COMPARISON:  CT HEAD Sep 17, 2016 FINDINGS: MRI BRAIN FINDINGS INTRACRANIAL CONTENTS: No reduced diffusion to suggest acute ischemia or hyperacute demyelination. No susceptibility artifact to suggest hemorrhage. The ventricles and sulci are normal for patient's age. No suspicious parenchymal signal, masses, mass effect. No abnormal intraparenchymal or extra-axial enhancement. No abnormal extra-axial fluid collections. No extra-axial masses. VASCULAR: Normal major intracranial vascular flow voids present at skull base. SKULL AND UPPER CERVICAL SPINE: No abnormal sellar expansion. No suspicious calvarial bone marrow signal. Craniocervical junction maintained. SINUSES/ORBITS: The mastoid air-cells and included paranasal  sinuses are well-aerated.The included ocular globes and orbital contents are non-suspicious. OTHER: None. MRI CERVICAL SPINE FINDINGS ALIGNMENT: Maintained cervical lordosis.  No malalignment. VERTEBRAE/DISCS: Vertebral bodies are intact. Intervertebral disc morphology's and signal are normal. No abnormal or acute bone marrow signal. No abnormal osseous or disc enhancement. CORD:Cervical spinal cord is normal morphology and signal characteristics. No abnormal cord, leptomeningeal or epidural enhancement. POSTERIOR FOSSA, VERTEBRAL ARTERIES, PARASPINAL TISSUES: No MR findings of ligamentous injury. Vertebral artery flow voids present. Included posterior fossa and paraspinal soft tissues are normal. DISC LEVELS: C2-3 through C7-T1: No disc bulge, canal stenosis nor neural foraminal narrowing. MRI THORACIC SPINE FINDINGS ALIGNMENT: Maintenance of the thoracic kyphosis.  No malalignment. VERTEBRAE/DISCS: Vertebral bodies are intact. Intervertebral discs morphology and signal are normal. T1 and T2 bright benign hemangioma T8. Old T11 inferior endplate Schmorl's node. No abnormal or acute bone marrow signal. No abnormal osseous or disc enhancement. CORD: Thoracic spinal cord is normal morphology and signal characteristics. No abnormal cord, LEFT-dominant or epidural enhancement. PREVERTEBRAL AND PARASPINAL SOFT TISSUES:  Normal. DISC LEVELS: Small T7-8 RIGHT central disc protrusion contacting the ventral spinal cord with mild cord deformity, enhancing annular fissure. No canal stenosis or neural foraminal narrowing at any level. MRI LUMBAR SPINE FINDINGS SEGMENTATION: For the purposes of this report, the last well-formed intervertebral disc is reported as L5-S1. transitional anatomy with small S1-2 disc. ALIGNMENT: Maintained lumbar lordosis. No malalignment. Mild lower lumbar dextroscoliosis inferred on axial sequences. VERTEBRAE:Vertebral bodies are intact. Chronic LEFT S1 pars interarticularis defect. Intervertebral  discs demonstrate normal morphology, mild desiccation L5-S1 disc. Mild chronic discogenic endplate changes Z3-G9. Scattered old Schmorl's nodes. No abnormal or acute bone marrow signal. No abnormal osseous or disc enhancement. CONUS MEDULLARIS AND CAUDA EQUINA: Conus medullaris terminates at L1-2 and demonstrates normal morphology and signal characteristics. Cauda equina is normal. No abnormal cord, LEFT-dominant or epidural enhancement. PARASPINAL AND OTHER SOFT TISSUES: Included prevertebral and paraspinal soft tissues are normal. DISC LEVELS: L1-2 through L4-5: No disc bulge, canal stenosis nor neural foraminal narrowing. L5-S1: 4 mm central disc protrusion with enhancing annular fissure without traversing S1 impingement. Mild facet arthropathy. No canal stenosis or neural foraminal narrowing. IMPRESSION: MRI head: 1. Normal MRI of the head with and without contrast. MRI cervical spine: 1. Normal MRI of the cervical spine with and without contrast. MRI thoracic spine: 1. Small T7-8 disc protrusion contacting and deforming the ventral spinal cord. 2. Otherwise negative MRI of the thoracic spine with and without contrast. MRI lumbar spine: 1. L5-S1 disc protrusion and annular fissure. 2. Otherwise negative MRI of the lumbar spine with and without contrast. Electronically Signed   By: Elon Alas M.D.   On: 12/16/2017 21:23   Mr Cervical Spine W Or Wo Contrast  Result Date: 12/16/2017 CLINICAL DATA:  Acute onset cold sensation LEFT foot while watching television this afternoon, persistent foot numbness. EXAM: MRI HEAD WITHOUT AND WITH CONTRAST MRI CERVICAL SPINE WITHOUT AND WITH CONTRAST MRI THORACIC AND LUMBAR SPINE WITHOUT AND WITH CONTRAST TECHNIQUE: Multiplanar, multiecho pulse sequences of the brain and surrounding structures, and cervical spine, to include the craniocervical junction and cervicothoracic junction, were obtained without and with intravenous contrast. Multiplanar and multiecho pulse  sequences of the thoracic and lumbar spine were obtained without and with intravenous contrast. CONTRAST:  5mL MULTIHANCE GADOBENATE DIMEGLUMINE 529 MG/ML IV SOLN COMPARISON:  CT HEAD Sep 17, 2016 FINDINGS: MRI BRAIN FINDINGS INTRACRANIAL CONTENTS: No reduced diffusion to suggest acute ischemia or hyperacute demyelination. No susceptibility artifact to suggest hemorrhage. The ventricles and sulci are normal for patient's age. No suspicious parenchymal signal, masses, mass effect. No abnormal intraparenchymal or extra-axial enhancement. No abnormal extra-axial fluid collections. No extra-axial masses. VASCULAR: Normal major intracranial vascular flow voids present at skull base. SKULL AND UPPER CERVICAL SPINE: No abnormal sellar expansion. No suspicious calvarial bone marrow signal. Craniocervical junction maintained. SINUSES/ORBITS: The mastoid air-cells and included paranasal sinuses are well-aerated.The included ocular globes and orbital contents are non-suspicious. OTHER: None. MRI CERVICAL SPINE FINDINGS ALIGNMENT: Maintained cervical lordosis.  No malalignment. VERTEBRAE/DISCS: Vertebral bodies are intact. Intervertebral disc morphology's and signal are normal. No abnormal or acute bone marrow signal. No abnormal osseous or disc  enhancement. CORD:Cervical spinal cord is normal morphology and signal characteristics. No abnormal cord, leptomeningeal or epidural enhancement. POSTERIOR FOSSA, VERTEBRAL ARTERIES, PARASPINAL TISSUES: No MR findings of ligamentous injury. Vertebral artery flow voids present. Included posterior fossa and paraspinal soft tissues are normal. DISC LEVELS: C2-3 through C7-T1: No disc bulge, canal stenosis nor neural foraminal narrowing. MRI THORACIC SPINE FINDINGS ALIGNMENT: Maintenance of the thoracic kyphosis. No malalignment. VERTEBRAE/DISCS: Vertebral bodies are intact. Intervertebral discs morphology and signal are normal. T1 and T2 bright benign hemangioma T8. Old T11 inferior  endplate Schmorl's node. No abnormal or acute bone marrow signal. No abnormal osseous or disc enhancement. CORD: Thoracic spinal cord is normal morphology and signal characteristics. No abnormal cord, LEFT-dominant or epidural enhancement. PREVERTEBRAL AND PARASPINAL SOFT TISSUES:  Normal. DISC LEVELS: Small T7-8 RIGHT central disc protrusion contacting the ventral spinal cord with mild cord deformity, enhancing annular fissure. No canal stenosis or neural foraminal narrowing at any level. MRI LUMBAR SPINE FINDINGS SEGMENTATION: For the purposes of this report, the last well-formed intervertebral disc is reported as L5-S1. transitional anatomy with small S1-2 disc. ALIGNMENT: Maintained lumbar lordosis. No malalignment. Mild lower lumbar dextroscoliosis inferred on axial sequences. VERTEBRAE:Vertebral bodies are intact. Chronic LEFT S1 pars interarticularis defect. Intervertebral discs demonstrate normal morphology, mild desiccation L5-S1 disc. Mild chronic discogenic endplate changes Z6-X0. Scattered old Schmorl's nodes. No abnormal or acute bone marrow signal. No abnormal osseous or disc enhancement. CONUS MEDULLARIS AND CAUDA EQUINA: Conus medullaris terminates at L1-2 and demonstrates normal morphology and signal characteristics. Cauda equina is normal. No abnormal cord, LEFT-dominant or epidural enhancement. PARASPINAL AND OTHER SOFT TISSUES: Included prevertebral and paraspinal soft tissues are normal. DISC LEVELS: L1-2 through L4-5: No disc bulge, canal stenosis nor neural foraminal narrowing. L5-S1: 4 mm central disc protrusion with enhancing annular fissure without traversing S1 impingement. Mild facet arthropathy. No canal stenosis or neural foraminal narrowing. IMPRESSION: MRI head: 1. Normal MRI of the head with and without contrast. MRI cervical spine: 1. Normal MRI of the cervical spine with and without contrast. MRI thoracic spine: 1. Small T7-8 disc protrusion contacting and deforming the ventral  spinal cord. 2. Otherwise negative MRI of the thoracic spine with and without contrast. MRI lumbar spine: 1. L5-S1 disc protrusion and annular fissure. 2. Otherwise negative MRI of the lumbar spine with and without contrast. Electronically Signed   By: Elon Alas M.D.   On: 12/16/2017 21:23   Mr Thoracic Spine W Wo Contrast  Result Date: 12/16/2017 CLINICAL DATA:  Acute onset cold sensation LEFT foot while watching television this afternoon, persistent foot numbness. EXAM: MRI HEAD WITHOUT AND WITH CONTRAST MRI CERVICAL SPINE WITHOUT AND WITH CONTRAST MRI THORACIC AND LUMBAR SPINE WITHOUT AND WITH CONTRAST TECHNIQUE: Multiplanar, multiecho pulse sequences of the brain and surrounding structures, and cervical spine, to include the craniocervical junction and cervicothoracic junction, were obtained without and with intravenous contrast. Multiplanar and multiecho pulse sequences of the thoracic and lumbar spine were obtained without and with intravenous contrast. CONTRAST:  97mL MULTIHANCE GADOBENATE DIMEGLUMINE 529 MG/ML IV SOLN COMPARISON:  CT HEAD Sep 17, 2016 FINDINGS: MRI BRAIN FINDINGS INTRACRANIAL CONTENTS: No reduced diffusion to suggest acute ischemia or hyperacute demyelination. No susceptibility artifact to suggest hemorrhage. The ventricles and sulci are normal for patient's age. No suspicious parenchymal signal, masses, mass effect. No abnormal intraparenchymal or extra-axial enhancement. No abnormal extra-axial fluid collections. No extra-axial masses. VASCULAR: Normal major intracranial vascular flow voids present at skull base. SKULL AND UPPER CERVICAL SPINE: No abnormal  sellar expansion. No suspicious calvarial bone marrow signal. Craniocervical junction maintained. SINUSES/ORBITS: The mastoid air-cells and included paranasal sinuses are well-aerated.The included ocular globes and orbital contents are non-suspicious. OTHER: None. MRI CERVICAL SPINE FINDINGS ALIGNMENT: Maintained cervical  lordosis.  No malalignment. VERTEBRAE/DISCS: Vertebral bodies are intact. Intervertebral disc morphology's and signal are normal. No abnormal or acute bone marrow signal. No abnormal osseous or disc enhancement. CORD:Cervical spinal cord is normal morphology and signal characteristics. No abnormal cord, leptomeningeal or epidural enhancement. POSTERIOR FOSSA, VERTEBRAL ARTERIES, PARASPINAL TISSUES: No MR findings of ligamentous injury. Vertebral artery flow voids present. Included posterior fossa and paraspinal soft tissues are normal. DISC LEVELS: C2-3 through C7-T1: No disc bulge, canal stenosis nor neural foraminal narrowing. MRI THORACIC SPINE FINDINGS ALIGNMENT: Maintenance of the thoracic kyphosis. No malalignment. VERTEBRAE/DISCS: Vertebral bodies are intact. Intervertebral discs morphology and signal are normal. T1 and T2 bright benign hemangioma T8. Old T11 inferior endplate Schmorl's node. No abnormal or acute bone marrow signal. No abnormal osseous or disc enhancement. CORD: Thoracic spinal cord is normal morphology and signal characteristics. No abnormal cord, LEFT-dominant or epidural enhancement. PREVERTEBRAL AND PARASPINAL SOFT TISSUES:  Normal. DISC LEVELS: Small T7-8 RIGHT central disc protrusion contacting the ventral spinal cord with mild cord deformity, enhancing annular fissure. No canal stenosis or neural foraminal narrowing at any level. MRI LUMBAR SPINE FINDINGS SEGMENTATION: For the purposes of this report, the last well-formed intervertebral disc is reported as L5-S1. transitional anatomy with small S1-2 disc. ALIGNMENT: Maintained lumbar lordosis. No malalignment. Mild lower lumbar dextroscoliosis inferred on axial sequences. VERTEBRAE:Vertebral bodies are intact. Chronic LEFT S1 pars interarticularis defect. Intervertebral discs demonstrate normal morphology, mild desiccation L5-S1 disc. Mild chronic discogenic endplate changes Z6-X0. Scattered old Schmorl's nodes. No abnormal or acute  bone marrow signal. No abnormal osseous or disc enhancement. CONUS MEDULLARIS AND CAUDA EQUINA: Conus medullaris terminates at L1-2 and demonstrates normal morphology and signal characteristics. Cauda equina is normal. No abnormal cord, LEFT-dominant or epidural enhancement. PARASPINAL AND OTHER SOFT TISSUES: Included prevertebral and paraspinal soft tissues are normal. DISC LEVELS: L1-2 through L4-5: No disc bulge, canal stenosis nor neural foraminal narrowing. L5-S1: 4 mm central disc protrusion with enhancing annular fissure without traversing S1 impingement. Mild facet arthropathy. No canal stenosis or neural foraminal narrowing. IMPRESSION: MRI head: 1. Normal MRI of the head with and without contrast. MRI cervical spine: 1. Normal MRI of the cervical spine with and without contrast. MRI thoracic spine: 1. Small T7-8 disc protrusion contacting and deforming the ventral spinal cord. 2. Otherwise negative MRI of the thoracic spine with and without contrast. MRI lumbar spine: 1. L5-S1 disc protrusion and annular fissure. 2. Otherwise negative MRI of the lumbar spine with and without contrast. Electronically Signed   By: Elon Alas M.D.   On: 12/16/2017 21:23   Mr Lumbar Spine W Wo Contrast  Result Date: 12/16/2017 CLINICAL DATA:  Acute onset cold sensation LEFT foot while watching television this afternoon, persistent foot numbness. EXAM: MRI HEAD WITHOUT AND WITH CONTRAST MRI CERVICAL SPINE WITHOUT AND WITH CONTRAST MRI THORACIC AND LUMBAR SPINE WITHOUT AND WITH CONTRAST TECHNIQUE: Multiplanar, multiecho pulse sequences of the brain and surrounding structures, and cervical spine, to include the craniocervical junction and cervicothoracic junction, were obtained without and with intravenous contrast. Multiplanar and multiecho pulse sequences of the thoracic and lumbar spine were obtained without and with intravenous contrast. CONTRAST:  61mL MULTIHANCE GADOBENATE DIMEGLUMINE 529 MG/ML IV SOLN COMPARISON:   CT HEAD Sep 17, 2016 FINDINGS: MRI BRAIN  FINDINGS INTRACRANIAL CONTENTS: No reduced diffusion to suggest acute ischemia or hyperacute demyelination. No susceptibility artifact to suggest hemorrhage. The ventricles and sulci are normal for patient's age. No suspicious parenchymal signal, masses, mass effect. No abnormal intraparenchymal or extra-axial enhancement. No abnormal extra-axial fluid collections. No extra-axial masses. VASCULAR: Normal major intracranial vascular flow voids present at skull base. SKULL AND UPPER CERVICAL SPINE: No abnormal sellar expansion. No suspicious calvarial bone marrow signal. Craniocervical junction maintained. SINUSES/ORBITS: The mastoid air-cells and included paranasal sinuses are well-aerated.The included ocular globes and orbital contents are non-suspicious. OTHER: None. MRI CERVICAL SPINE FINDINGS ALIGNMENT: Maintained cervical lordosis.  No malalignment. VERTEBRAE/DISCS: Vertebral bodies are intact. Intervertebral disc morphology's and signal are normal. No abnormal or acute bone marrow signal. No abnormal osseous or disc enhancement. CORD:Cervical spinal cord is normal morphology and signal characteristics. No abnormal cord, leptomeningeal or epidural enhancement. POSTERIOR FOSSA, VERTEBRAL ARTERIES, PARASPINAL TISSUES: No MR findings of ligamentous injury. Vertebral artery flow voids present. Included posterior fossa and paraspinal soft tissues are normal. DISC LEVELS: C2-3 through C7-T1: No disc bulge, canal stenosis nor neural foraminal narrowing. MRI THORACIC SPINE FINDINGS ALIGNMENT: Maintenance of the thoracic kyphosis. No malalignment. VERTEBRAE/DISCS: Vertebral bodies are intact. Intervertebral discs morphology and signal are normal. T1 and T2 bright benign hemangioma T8. Old T11 inferior endplate Schmorl's node. No abnormal or acute bone marrow signal. No abnormal osseous or disc enhancement. CORD: Thoracic spinal cord is normal morphology and signal characteristics.  No abnormal cord, LEFT-dominant or epidural enhancement. PREVERTEBRAL AND PARASPINAL SOFT TISSUES:  Normal. DISC LEVELS: Small T7-8 RIGHT central disc protrusion contacting the ventral spinal cord with mild cord deformity, enhancing annular fissure. No canal stenosis or neural foraminal narrowing at any level. MRI LUMBAR SPINE FINDINGS SEGMENTATION: For the purposes of this report, the last well-formed intervertebral disc is reported as L5-S1. transitional anatomy with small S1-2 disc. ALIGNMENT: Maintained lumbar lordosis. No malalignment. Mild lower lumbar dextroscoliosis inferred on axial sequences. VERTEBRAE:Vertebral bodies are intact. Chronic LEFT S1 pars interarticularis defect. Intervertebral discs demonstrate normal morphology, mild desiccation L5-S1 disc. Mild chronic discogenic endplate changes C6-C3. Scattered old Schmorl's nodes. No abnormal or acute bone marrow signal. No abnormal osseous or disc enhancement. CONUS MEDULLARIS AND CAUDA EQUINA: Conus medullaris terminates at L1-2 and demonstrates normal morphology and signal characteristics. Cauda equina is normal. No abnormal cord, LEFT-dominant or epidural enhancement. PARASPINAL AND OTHER SOFT TISSUES: Included prevertebral and paraspinal soft tissues are normal. DISC LEVELS: L1-2 through L4-5: No disc bulge, canal stenosis nor neural foraminal narrowing. L5-S1: 4 mm central disc protrusion with enhancing annular fissure without traversing S1 impingement. Mild facet arthropathy. No canal stenosis or neural foraminal narrowing. IMPRESSION: MRI head: 1. Normal MRI of the head with and without contrast. MRI cervical spine: 1. Normal MRI of the cervical spine with and without contrast. MRI thoracic spine: 1. Small T7-8 disc protrusion contacting and deforming the ventral spinal cord. 2. Otherwise negative MRI of the thoracic spine with and without contrast. MRI lumbar spine: 1. L5-S1 disc protrusion and annular fissure. 2. Otherwise negative MRI of the  lumbar spine with and without contrast. Electronically Signed   By: Elon Alas M.D.   On: 12/16/2017 21:23     I have reviewed the above imaging : MRI  Brain, C spine, T spine and L spine   ASSESSMENT AND PLAN  27 year old with 5-day history of left foot drop followed by now fairly rapid progression of symptoms to include the entire left leg.  However on exam,  effort appears to be poor.  He maintains tone and does slightly move his left leg when asked to get up from supine to sitting position.  Also sensory exam-he has complete loss of sensation over entire left leg as well as left upper trunk.  His MRI brain is normal and spinal imaging showed minor changes (small disc protrusion at T7-T8 and L5-S1) which would not explain patient's profound weakness and examination.  His reflexes are intact, and even the atypical presentation of Iline Oven is unlikely.   Left leg weakness- suspect conversion disorder  Recommendations PT.OT evaluation No further inpatient neurological testing is required. Pyschiatry consult if symptoms do not improve.   Cashel Bellina [facility] Pager Number [phone]

## 2017-12-17 NOTE — H&P (Signed)
History and Physical    Ian Lewis ZOX:[id number] DOB: 28-Jul-1990 DOA: 12/16/2017  PCP: Patient, No Pcp Per  Patient coming from: Home.  Chief Complaint: Left lower extremity weakness.  HPI: Ian Lewis is a 27 y.o. male with no significant past medical history presents to the ER with complaints of increasing left lower extremity weakness.  Patient symptoms started 4 days ago on August 3 with numbness and weakness of the left foot.  That time patient came to the ER and was also evaluated by vascular surgeon and felt that patient did not have anything acute.  Patient states since then his symptoms have persisted and last 24 hours has have increasing left lower extremity weakness with numbness progressing up to his thighs.  Denies any headache or any weakness of the right lower extremity or upper extremities.  Denies any fever chills.  ED Course: In the ER on exam patient has good deep tendon reflexes unable to move his left lower extremity on request.  Neurologist evaluated the patient and ordered MRI of the brain and entire spine which did not show anything acute.  At this time neurology recommended admission for further observation since patient is not able to get up and walk due to unable to move his legs.  Review of Systems: As per HPI, rest all negative.   History reviewed. No pertinent past medical history.  Past Surgical History:  Procedure Laterality Date  . KNEE SURGERY    . MANDIBLE FRACTURE SURGERY       reports that he has never smoked. He has never used smokeless tobacco. He reports that he does not drink alcohol or use drugs.  Allergies  Allergen Reactions  . Lorabid [Loracarbef] Hives    Family History - father has atrial fibrillation.  Prior to Admission medications   Medication Sig Start Date End Date Taking? Authorizing Provider  acetaminophen (TYLENOL) 500 MG tablet Take 1,000 mg by mouth every 6 (six) hours as needed.    [provider]    amoxicillin-clavulanate (AUGMENTIN) 875-125 MG per tablet Take 1 tablet by mouth 2 (two) times daily. Patient not taking: Reported on 09/17/2016 05/05/14   Roselee Culver, MD  loratadine (CLARITIN) 10 MG tablet Take 20 mg by mouth daily as needed for allergies.    [provider]  Multiple Vitamins-Minerals (MULTIVITAMINS THER. W/MINERALS) TABS Take 1 tablet by mouth daily.      [provider]  naproxen (NAPROSYN) 500 MG tablet Take 1 tablet (500 mg total) by mouth 2 (two) times daily. Patient not taking: Reported on 05/05/2014 12/16/13   Comer Locket, PA-C  promethazine-codeine (PHENERGAN WITH CODEINE) 6.25-10 MG/5ML syrup Take 5-10 mLs by mouth every 4 (four) hours as needed for cough. Patient not taking: Reported on 09/17/2016 05/05/14   Roselee Culver, MD    Physical Exam: Vitals:   12/16/17 2145 12/16/17 2315 12/17/17 0000 12/17/17 0045  BP: 111/82 118/84 122/84 (!) 121/92  Pulse: 93 94 82 92  Resp: 16 19 (!) 21 11  Temp:      TempSrc:      SpO2: 95% 98% 96% 95%      Constitutional: Moderately built and nourished. Vitals:   12/16/17 2145 12/16/17 2315 12/17/17 0000 12/17/17 0045  BP: 111/82 118/84 122/84 (!) 121/92  Pulse: 93 94 82 92  Resp: 16 19 (!) 21 11  Temp:      TempSrc:      SpO2: 95% 98% 96% 95%   Eyes: Anicteric no  pallor. ENMT: No discharge from the ears eyes nose or mouth. Neck: No mass felt.  No neck rigidity.  No JVD appreciated. Respiratory: No rhonchi or crepitations. Cardiovascular: S1-S2 heard no murmurs appreciated. Abdomen: Soft nontender bowel sounds present. Musculoskeletal: No edema.  No joint effusion. Skin: No rash. Neurologic: Alert awake oriented to time place and person.  Able to move his left lower extremity otherwise has good deep tendon reflexes.  Rest of the extremities he is 5 x 5.  No facial asymmetry. Psychiatric: Appears normal.   Labs on Admission: I have personally reviewed following labs and imaging  studies  CBC: Recent Labs  Lab 12/12/17 1820  WBC 8.0  HGB 17.1*  HCT 49.7  MCV 87.0  PLT 353   Basic Metabolic Panel: Recent Labs  Lab 12/12/17 1820  NA 139  K 4.2  CL 108  CO2 23  GLUCOSE 95  BUN 14  CREATININE 0.93  CALCIUM 9.0   GFR: Estimated Creatinine Clearance: 121.8 mL/min (by C-G formula based on SCr of 0.93 mg/dL). Liver Function Tests: No results for input(s): AST, ALT, ALKPHOS, BILITOT, PROT, ALBUMIN in the last 168 hours. No results for input(s): LIPASE, AMYLASE in the last 168 hours. No results for input(s): AMMONIA in the last 168 hours. Coagulation Profile: No results for input(s): INR, PROTIME in the last 168 hours. Cardiac Enzymes: No results for input(s): CKTOTAL, CKMB, CKMBINDEX, TROPONINI in the last 168 hours. BNP (last 3 results) No results for input(s): PROBNP in the last 8760 hours. HbA1C: No results for input(s): HGBA1C in the last 72 hours. CBG: No results for input(s): GLUCAP in the last 168 hours. Lipid Profile: No results for input(s): CHOL, HDL, LDLCALC, TRIG, CHOLHDL, LDLDIRECT in the last 72 hours. Thyroid Function Tests: No results for input(s): TSH, T4TOTAL, FREET4, T3FREE, THYROIDAB in the last 72 hours. Anemia Panel: No results for input(s): VITAMINB12, FOLATE, FERRITIN, TIBC, IRON, RETICCTPCT in the last 72 hours. Urine analysis:    Component Value Date/Time   COLORURINE STRAW (A) 09/17/2016 1624   APPEARANCEUR CLEAR 09/17/2016 1624   LABSPEC 1.008 09/17/2016 1624   PHURINE 6.0 09/17/2016 1624   GLUCOSEU NEGATIVE 09/17/2016 1624   HGBUR NEGATIVE 09/17/2016 1624   BILIRUBINUR NEGATIVE 09/17/2016 1624   KETONESUR NEGATIVE 09/17/2016 1624   PROTEINUR NEGATIVE 09/17/2016 1624   NITRITE NEGATIVE 09/17/2016 1624   LEUKOCYTESUR NEGATIVE 09/17/2016 1624   Sepsis Labs: @LABRCNTIP (procalcitonin:4,lacticidven:4) )No results found for this or any previous visit (from the past 240 hour(s)).   Radiological Exams on  Admission: Mr Ian Lewis And Wo Contrast  Result Date: 12/16/2017 CLINICAL DATA:  Acute onset cold sensation LEFT foot while watching television this afternoon, persistent foot numbness. EXAM: MRI HEAD WITHOUT AND WITH CONTRAST MRI CERVICAL SPINE WITHOUT AND WITH CONTRAST MRI THORACIC AND LUMBAR SPINE WITHOUT AND WITH CONTRAST TECHNIQUE: Multiplanar, multiecho pulse sequences of the brain and surrounding structures, and cervical spine, to include the craniocervical junction and cervicothoracic junction, were obtained without and with intravenous contrast. Multiplanar and multiecho pulse sequences of the thoracic and lumbar spine were obtained without and with intravenous contrast. CONTRAST:  4mL MULTIHANCE GADOBENATE DIMEGLUMINE 529 MG/ML IV SOLN COMPARISON:  CT HEAD Sep 17, 2016 FINDINGS: MRI BRAIN FINDINGS INTRACRANIAL CONTENTS: No reduced diffusion to suggest acute ischemia or hyperacute demyelination. No susceptibility artifact to suggest hemorrhage. The ventricles and sulci are normal for patient's age. No suspicious parenchymal signal, masses, mass effect. No abnormal intraparenchymal or extra-axial enhancement. No abnormal extra-axial fluid collections. No extra-axial  masses. VASCULAR: Normal major intracranial vascular flow voids present at skull base. SKULL AND UPPER CERVICAL SPINE: No abnormal sellar expansion. No suspicious calvarial bone marrow signal. Craniocervical junction maintained. SINUSES/ORBITS: The mastoid air-cells and included paranasal sinuses are well-aerated.The included ocular globes and orbital contents are non-suspicious. OTHER: None. MRI CERVICAL SPINE FINDINGS ALIGNMENT: Maintained cervical lordosis.  No malalignment. VERTEBRAE/DISCS: Vertebral bodies are intact. Intervertebral disc morphology's and signal are normal. No abnormal or acute bone marrow signal. No abnormal osseous or disc enhancement. CORD:Cervical spinal cord is normal morphology and signal characteristics. No abnormal  cord, leptomeningeal or epidural enhancement. POSTERIOR FOSSA, VERTEBRAL ARTERIES, PARASPINAL TISSUES: No MR findings of ligamentous injury. Vertebral artery flow voids present. Included posterior fossa and paraspinal soft tissues are normal. DISC LEVELS: C2-3 through C7-T1: No disc bulge, canal stenosis nor neural foraminal narrowing. MRI THORACIC SPINE FINDINGS ALIGNMENT: Maintenance of the thoracic kyphosis. No malalignment. VERTEBRAE/DISCS: Vertebral bodies are intact. Intervertebral discs morphology and signal are normal. T1 and T2 bright benign hemangioma T8. Old T11 inferior endplate Schmorl's node. No abnormal or acute bone marrow signal. No abnormal osseous or disc enhancement. CORD: Thoracic spinal cord is normal morphology and signal characteristics. No abnormal cord, LEFT-dominant or epidural enhancement. PREVERTEBRAL AND PARASPINAL SOFT TISSUES:  Normal. DISC LEVELS: Small T7-8 RIGHT central disc protrusion contacting the ventral spinal cord with mild cord deformity, enhancing annular fissure. No canal stenosis or neural foraminal narrowing at any level. MRI LUMBAR SPINE FINDINGS SEGMENTATION: For the purposes of this report, the last well-formed intervertebral disc is reported as L5-S1. transitional anatomy with small S1-2 disc. ALIGNMENT: Maintained lumbar lordosis. No malalignment. Mild lower lumbar dextroscoliosis inferred on axial sequences. VERTEBRAE:Vertebral bodies are intact. Chronic LEFT S1 pars interarticularis defect. Intervertebral discs demonstrate normal morphology, mild desiccation L5-S1 disc. Mild chronic discogenic endplate changes L8-V5. Scattered old Schmorl's nodes. No abnormal or acute bone marrow signal. No abnormal osseous or disc enhancement. CONUS MEDULLARIS AND CAUDA EQUINA: Conus medullaris terminates at L1-2 and demonstrates normal morphology and signal characteristics. Cauda equina is normal. No abnormal cord, LEFT-dominant or epidural enhancement. PARASPINAL AND OTHER SOFT  TISSUES: Included prevertebral and paraspinal soft tissues are normal. DISC LEVELS: L1-2 through L4-5: No disc bulge, canal stenosis nor neural foraminal narrowing. L5-S1: 4 mm central disc protrusion with enhancing annular fissure without traversing S1 impingement. Mild facet arthropathy. No canal stenosis or neural foraminal narrowing. IMPRESSION: MRI head: 1. Normal MRI of the head with and without contrast. MRI cervical spine: 1. Normal MRI of the cervical spine with and without contrast. MRI thoracic spine: 1. Small T7-8 disc protrusion contacting and deforming the ventral spinal cord. 2. Otherwise negative MRI of the thoracic spine with and without contrast. MRI lumbar spine: 1. L5-S1 disc protrusion and annular fissure. 2. Otherwise negative MRI of the lumbar spine with and without contrast. Electronically Signed   By: Elon Alas M.D.   On: 12/16/2017 21:23   Mr Cervical Spine W Or Wo Contrast  Result Date: 12/16/2017 CLINICAL DATA:  Acute onset cold sensation LEFT foot while watching television this afternoon, persistent foot numbness. EXAM: MRI HEAD WITHOUT AND WITH CONTRAST MRI CERVICAL SPINE WITHOUT AND WITH CONTRAST MRI THORACIC AND LUMBAR SPINE WITHOUT AND WITH CONTRAST TECHNIQUE: Multiplanar, multiecho pulse sequences of the brain and surrounding structures, and cervical spine, to include the craniocervical junction and cervicothoracic junction, were obtained without and with intravenous contrast. Multiplanar and multiecho pulse sequences of the thoracic and lumbar spine were obtained without and with intravenous contrast. CONTRAST:  6mL MULTIHANCE GADOBENATE DIMEGLUMINE 529 MG/ML IV SOLN COMPARISON:  CT HEAD Sep 17, 2016 FINDINGS: MRI BRAIN FINDINGS INTRACRANIAL CONTENTS: No reduced diffusion to suggest acute ischemia or hyperacute demyelination. No susceptibility artifact to suggest hemorrhage. The ventricles and sulci are normal for patient's age. No suspicious parenchymal signal, masses,  mass effect. No abnormal intraparenchymal or extra-axial enhancement. No abnormal extra-axial fluid collections. No extra-axial masses. VASCULAR: Normal major intracranial vascular flow voids present at skull base. SKULL AND UPPER CERVICAL SPINE: No abnormal sellar expansion. No suspicious calvarial bone marrow signal. Craniocervical junction maintained. SINUSES/ORBITS: The mastoid air-cells and included paranasal sinuses are well-aerated.The included ocular globes and orbital contents are non-suspicious. OTHER: None. MRI CERVICAL SPINE FINDINGS ALIGNMENT: Maintained cervical lordosis.  No malalignment. VERTEBRAE/DISCS: Vertebral bodies are intact. Intervertebral disc morphology's and signal are normal. No abnormal or acute bone marrow signal. No abnormal osseous or disc enhancement. CORD:Cervical spinal cord is normal morphology and signal characteristics. No abnormal cord, leptomeningeal or epidural enhancement. POSTERIOR FOSSA, VERTEBRAL ARTERIES, PARASPINAL TISSUES: No MR findings of ligamentous injury. Vertebral artery flow voids present. Included posterior fossa and paraspinal soft tissues are normal. DISC LEVELS: C2-3 through C7-T1: No disc bulge, canal stenosis nor neural foraminal narrowing. MRI THORACIC SPINE FINDINGS ALIGNMENT: Maintenance of the thoracic kyphosis. No malalignment. VERTEBRAE/DISCS: Vertebral bodies are intact. Intervertebral discs morphology and signal are normal. T1 and T2 bright benign hemangioma T8. Old T11 inferior endplate Schmorl's node. No abnormal or acute bone marrow signal. No abnormal osseous or disc enhancement. CORD: Thoracic spinal cord is normal morphology and signal characteristics. No abnormal cord, LEFT-dominant or epidural enhancement. PREVERTEBRAL AND PARASPINAL SOFT TISSUES:  Normal. DISC LEVELS: Small T7-8 RIGHT central disc protrusion contacting the ventral spinal cord with mild cord deformity, enhancing annular fissure. No canal stenosis or neural foraminal  narrowing at any level. MRI LUMBAR SPINE FINDINGS SEGMENTATION: For the purposes of this report, the last well-formed intervertebral disc is reported as L5-S1. transitional anatomy with small S1-2 disc. ALIGNMENT: Maintained lumbar lordosis. No malalignment. Mild lower lumbar dextroscoliosis inferred on axial sequences. VERTEBRAE:Vertebral bodies are intact. Chronic LEFT S1 pars interarticularis defect. Intervertebral discs demonstrate normal morphology, mild desiccation L5-S1 disc. Mild chronic discogenic endplate changes B5-Z0. Scattered old Schmorl's nodes. No abnormal or acute bone marrow signal. No abnormal osseous or disc enhancement. CONUS MEDULLARIS AND CAUDA EQUINA: Conus medullaris terminates at L1-2 and demonstrates normal morphology and signal characteristics. Cauda equina is normal. No abnormal cord, LEFT-dominant or epidural enhancement. PARASPINAL AND OTHER SOFT TISSUES: Included prevertebral and paraspinal soft tissues are normal. DISC LEVELS: L1-2 through L4-5: No disc bulge, canal stenosis nor neural foraminal narrowing. L5-S1: 4 mm central disc protrusion with enhancing annular fissure without traversing S1 impingement. Mild facet arthropathy. No canal stenosis or neural foraminal narrowing. IMPRESSION: MRI head: 1. Normal MRI of the head with and without contrast. MRI cervical spine: 1. Normal MRI of the cervical spine with and without contrast. MRI thoracic spine: 1. Small T7-8 disc protrusion contacting and deforming the ventral spinal cord. 2. Otherwise negative MRI of the thoracic spine with and without contrast. MRI lumbar spine: 1. L5-S1 disc protrusion and annular fissure. 2. Otherwise negative MRI of the lumbar spine with and without contrast. Electronically Signed   By: Elon Alas M.D.   On: 12/16/2017 21:23   Mr Thoracic Spine W Wo Contrast  Result Date: 12/16/2017 CLINICAL DATA:  Acute onset cold sensation LEFT foot while watching television this afternoon, persistent foot  numbness. EXAM: MRI HEAD WITHOUT AND  WITH CONTRAST MRI CERVICAL SPINE WITHOUT AND WITH CONTRAST MRI THORACIC AND LUMBAR SPINE WITHOUT AND WITH CONTRAST TECHNIQUE: Multiplanar, multiecho pulse sequences of the brain and surrounding structures, and cervical spine, to include the craniocervical junction and cervicothoracic junction, were obtained without and with intravenous contrast. Multiplanar and multiecho pulse sequences of the thoracic and lumbar spine were obtained without and with intravenous contrast. CONTRAST:  43mL MULTIHANCE GADOBENATE DIMEGLUMINE 529 MG/ML IV SOLN COMPARISON:  CT HEAD Sep 17, 2016 FINDINGS: MRI BRAIN FINDINGS INTRACRANIAL CONTENTS: No reduced diffusion to suggest acute ischemia or hyperacute demyelination. No susceptibility artifact to suggest hemorrhage. The ventricles and sulci are normal for patient's age. No suspicious parenchymal signal, masses, mass effect. No abnormal intraparenchymal or extra-axial enhancement. No abnormal extra-axial fluid collections. No extra-axial masses. VASCULAR: Normal major intracranial vascular flow voids present at skull base. SKULL AND UPPER CERVICAL SPINE: No abnormal sellar expansion. No suspicious calvarial bone marrow signal. Craniocervical junction maintained. SINUSES/ORBITS: The mastoid air-cells and included paranasal sinuses are well-aerated.The included ocular globes and orbital contents are non-suspicious. OTHER: None. MRI CERVICAL SPINE FINDINGS ALIGNMENT: Maintained cervical lordosis.  No malalignment. VERTEBRAE/DISCS: Vertebral bodies are intact. Intervertebral disc morphology's and signal are normal. No abnormal or acute bone marrow signal. No abnormal osseous or disc enhancement. CORD:Cervical spinal cord is normal morphology and signal characteristics. No abnormal cord, leptomeningeal or epidural enhancement. POSTERIOR FOSSA, VERTEBRAL ARTERIES, PARASPINAL TISSUES: No MR findings of ligamentous injury. Vertebral artery flow voids present.  Included posterior fossa and paraspinal soft tissues are normal. DISC LEVELS: C2-3 through C7-T1: No disc bulge, canal stenosis nor neural foraminal narrowing. MRI THORACIC SPINE FINDINGS ALIGNMENT: Maintenance of the thoracic kyphosis. No malalignment. VERTEBRAE/DISCS: Vertebral bodies are intact. Intervertebral discs morphology and signal are normal. T1 and T2 bright benign hemangioma T8. Old T11 inferior endplate Schmorl's node. No abnormal or acute bone marrow signal. No abnormal osseous or disc enhancement. CORD: Thoracic spinal cord is normal morphology and signal characteristics. No abnormal cord, LEFT-dominant or epidural enhancement. PREVERTEBRAL AND PARASPINAL SOFT TISSUES:  Normal. DISC LEVELS: Small T7-8 RIGHT central disc protrusion contacting the ventral spinal cord with mild cord deformity, enhancing annular fissure. No canal stenosis or neural foraminal narrowing at any level. MRI LUMBAR SPINE FINDINGS SEGMENTATION: For the purposes of this report, the last well-formed intervertebral disc is reported as L5-S1. transitional anatomy with small S1-2 disc. ALIGNMENT: Maintained lumbar lordosis. No malalignment. Mild lower lumbar dextroscoliosis inferred on axial sequences. VERTEBRAE:Vertebral bodies are intact. Chronic LEFT S1 pars interarticularis defect. Intervertebral discs demonstrate normal morphology, mild desiccation L5-S1 disc. Mild chronic discogenic endplate changes Q5-Z5. Scattered old Schmorl's nodes. No abnormal or acute bone marrow signal. No abnormal osseous or disc enhancement. CONUS MEDULLARIS AND CAUDA EQUINA: Conus medullaris terminates at L1-2 and demonstrates normal morphology and signal characteristics. Cauda equina is normal. No abnormal cord, LEFT-dominant or epidural enhancement. PARASPINAL AND OTHER SOFT TISSUES: Included prevertebral and paraspinal soft tissues are normal. DISC LEVELS: L1-2 through L4-5: No disc bulge, canal stenosis nor neural foraminal narrowing. L5-S1: 4 mm  central disc protrusion with enhancing annular fissure without traversing S1 impingement. Mild facet arthropathy. No canal stenosis or neural foraminal narrowing. IMPRESSION: MRI head: 1. Normal MRI of the head with and without contrast. MRI cervical spine: 1. Normal MRI of the cervical spine with and without contrast. MRI thoracic spine: 1. Small T7-8 disc protrusion contacting and deforming the ventral spinal cord. 2. Otherwise negative MRI of the thoracic spine with and without contrast. MRI lumbar spine: 1.  L5-S1 disc protrusion and annular fissure. 2. Otherwise negative MRI of the lumbar spine with and without contrast. Electronically Signed   By: Elon Alas M.D.   On: 12/16/2017 21:23   Mr Lumbar Spine W Wo Contrast  Result Date: 12/16/2017 CLINICAL DATA:  Acute onset cold sensation LEFT foot while watching television this afternoon, persistent foot numbness. EXAM: MRI HEAD WITHOUT AND WITH CONTRAST MRI CERVICAL SPINE WITHOUT AND WITH CONTRAST MRI THORACIC AND LUMBAR SPINE WITHOUT AND WITH CONTRAST TECHNIQUE: Multiplanar, multiecho pulse sequences of the brain and surrounding structures, and cervical spine, to include the craniocervical junction and cervicothoracic junction, were obtained without and with intravenous contrast. Multiplanar and multiecho pulse sequences of the thoracic and lumbar spine were obtained without and with intravenous contrast. CONTRAST:  43mL MULTIHANCE GADOBENATE DIMEGLUMINE 529 MG/ML IV SOLN COMPARISON:  CT HEAD Sep 17, 2016 FINDINGS: MRI BRAIN FINDINGS INTRACRANIAL CONTENTS: No reduced diffusion to suggest acute ischemia or hyperacute demyelination. No susceptibility artifact to suggest hemorrhage. The ventricles and sulci are normal for patient's age. No suspicious parenchymal signal, masses, mass effect. No abnormal intraparenchymal or extra-axial enhancement. No abnormal extra-axial fluid collections. No extra-axial masses. VASCULAR: Normal major intracranial vascular  flow voids present at skull base. SKULL AND UPPER CERVICAL SPINE: No abnormal sellar expansion. No suspicious calvarial bone marrow signal. Craniocervical junction maintained. SINUSES/ORBITS: The mastoid air-cells and included paranasal sinuses are well-aerated.The included ocular globes and orbital contents are non-suspicious. OTHER: None. MRI CERVICAL SPINE FINDINGS ALIGNMENT: Maintained cervical lordosis.  No malalignment. VERTEBRAE/DISCS: Vertebral bodies are intact. Intervertebral disc morphology's and signal are normal. No abnormal or acute bone marrow signal. No abnormal osseous or disc enhancement. CORD:Cervical spinal cord is normal morphology and signal characteristics. No abnormal cord, leptomeningeal or epidural enhancement. POSTERIOR FOSSA, VERTEBRAL ARTERIES, PARASPINAL TISSUES: No MR findings of ligamentous injury. Vertebral artery flow voids present. Included posterior fossa and paraspinal soft tissues are normal. DISC LEVELS: C2-3 through C7-T1: No disc bulge, canal stenosis nor neural foraminal narrowing. MRI THORACIC SPINE FINDINGS ALIGNMENT: Maintenance of the thoracic kyphosis. No malalignment. VERTEBRAE/DISCS: Vertebral bodies are intact. Intervertebral discs morphology and signal are normal. T1 and T2 bright benign hemangioma T8. Old T11 inferior endplate Schmorl's node. No abnormal or acute bone marrow signal. No abnormal osseous or disc enhancement. CORD: Thoracic spinal cord is normal morphology and signal characteristics. No abnormal cord, LEFT-dominant or epidural enhancement. PREVERTEBRAL AND PARASPINAL SOFT TISSUES:  Normal. DISC LEVELS: Small T7-8 RIGHT central disc protrusion contacting the ventral spinal cord with mild cord deformity, enhancing annular fissure. No canal stenosis or neural foraminal narrowing at any level. MRI LUMBAR SPINE FINDINGS SEGMENTATION: For the purposes of this report, the last well-formed intervertebral disc is reported as L5-S1. transitional anatomy with  small S1-2 disc. ALIGNMENT: Maintained lumbar lordosis. No malalignment. Mild lower lumbar dextroscoliosis inferred on axial sequences. VERTEBRAE:Vertebral bodies are intact. Chronic LEFT S1 pars interarticularis defect. Intervertebral discs demonstrate normal morphology, mild desiccation L5-S1 disc. Mild chronic discogenic endplate changes O6-V6. Scattered old Schmorl's nodes. No abnormal or acute bone marrow signal. No abnormal osseous or disc enhancement. CONUS MEDULLARIS AND CAUDA EQUINA: Conus medullaris terminates at L1-2 and demonstrates normal morphology and signal characteristics. Cauda equina is normal. No abnormal cord, LEFT-dominant or epidural enhancement. PARASPINAL AND OTHER SOFT TISSUES: Included prevertebral and paraspinal soft tissues are normal. DISC LEVELS: L1-2 through L4-5: No disc bulge, canal stenosis nor neural foraminal narrowing. L5-S1: 4 mm central disc protrusion with enhancing annular fissure without traversing S1 impingement. Mild facet  arthropathy. No canal stenosis or neural foraminal narrowing. IMPRESSION: MRI head: 1. Normal MRI of the head with and without contrast. MRI cervical spine: 1. Normal MRI of the cervical spine with and without contrast. MRI thoracic spine: 1. Small T7-8 disc protrusion contacting and deforming the ventral spinal cord. 2. Otherwise negative MRI of the thoracic spine with and without contrast. MRI lumbar spine: 1. L5-S1 disc protrusion and annular fissure. 2. Otherwise negative MRI of the lumbar spine with and without contrast. Electronically Signed   By: Elon Alas M.D.   On: 12/16/2017 21:23     Assessment/Plan Principal Problem:   Lower extremity weakness    1. Left lower extremity weakness cause not clear.  Discussed with neurologist at this time since MRI is not showing anything acute concern for conversion syndrome.  Consult psychiatrist in a.m.  Get physical therapy consult.  Check TSH.   DVT prophylaxis: SCDs. Code Status: Full  code. Family Communication: Discussed with patient. Disposition Plan: Home. Consults called: Neurology.  Physical therapy. Admission status: Observation.   Rise Patience MD [facility] Pager [phone].  If 7PM-7AM, please contact night-coverage [email or web address] Password [email or web address]  12/17/2017, 1:39 AM

## 2017-12-17 NOTE — Plan of Care (Signed)
  Problem: Coping: Goal: Level of anxiety will decrease Outcome: Progressing   Problem: Elimination: Goal: Will not experience complications related to urinary retention Outcome: Progressing   Problem: Safety: Goal: Ability to remain free from injury will improve Outcome: Progressing   

## 2017-12-18 NOTE — Discharge Summary (Signed)
Physician Discharge Summary  Ian Lewis RSW:[id number] DOB: 12-28-1990 DOA: 12/16/2017  PCP: Patient, No Pcp Per  Admit date: 12/16/2017 Discharge date: 12/18/2017  Time spent: 25* minutes  Recommendations for Outpatient Follow-up:  1. Follow up PCP in 2 weeks   Discharge Diagnoses:  Principal Problem:   Lower extremity weakness   Discharge Condition: Stable  Diet recommendation: regular diet  Filed Weights   12/17/17 0237  Weight: 81.6 kg    History of present illness:  27 year old male with no medical history came with complaints of increasing left lower extremity weakness.  He was seen by vascular surgeon and was cleared by surgery and he did not have anything acute.  Patient says the symptoms have persisted for the past 24 hours.  He was also seen by neurology and was diagnosed with conversion disorder.  MRI of the thoracic lumbar spine and MRI brain did not show any abnormality which could explain patient's symptoms.  Patient also complained of urinary retention and was able to hold urine, and did urinate on his own while he was going to get straight cath.  Hospital Course:   Conversion disorder-patient presented with left lower extremity weakness and numbness along with urinary retention, he was evaluated by neurology and deemed that this was a conversion disorder.  Patient was also seen by physical therapy and was able to walk some but was not requiring as much effort.  Outpatient PT was recommended.  I offered patient psychiatric evaluation but he declined.  He denies any suicidal ideation or being depressed. At this time patient's symptoms have resolved.  He was able to urinate on his own.  Moving extremities 5/5 in all 4 extremities.  He will be discharged home.  He can follow-up with PCP as needed.  Also patient has been told to get outpatient for psychiatric evaluation if he continues to have the symptoms.  Procedures:  None  Consultations:  Neurology  Discharge  Exam: Vitals:   12/17/17 2014 12/18/17 0410  BP: 124/74 117/70  Pulse: 68 64  Resp: 16 15  Temp: 98.8 F (37.1 C) 97.9 F (36.6 C)  SpO2: 97% 96%    General: Appears in no acute distress Cardiovascular: S1-S2, regular Respiratory: Clear to auscultation bilaterally Neurological-alert oriented x3, moving all extremities.  Strength 5/5 in all extremities.  No sensory deficit.  Discharge Instructions   Discharge Instructions    Diet - low sodium heart healthy   Complete by:  As directed    Increase activity slowly   Complete by:  As directed      Allergies as of 12/18/2017      Reactions   Lorabid [loracarbef] Hives      Medication List    STOP taking these medications   amoxicillin-clavulanate 875-125 MG tablet Commonly known as:  AUGMENTIN   naproxen 500 MG tablet Commonly known as:  NAPROSYN   promethazine-codeine 6.25-10 MG/5ML syrup Commonly known as:  PHENERGAN with CODEINE     TAKE these medications   acetaminophen 500 MG tablet Commonly known as:  TYLENOL Take 1,000 mg by mouth every 6 (six) hours as needed.   loratadine 10 MG tablet Commonly known as:  CLARITIN Take 20 mg by mouth daily as needed for allergies.   multivitamins ther. w/minerals Tabs tablet Take 1 tablet by mouth daily.      Allergies  Allergen Reactions  . Lorabid [Loracarbef] Hives      The results of significant diagnostics from this hospitalization (including imaging, microbiology, ancillary and  laboratory) are listed below for reference.    Significant Diagnostic Studies: Dg Chest 2 View  Result Date: 12/12/2017 CLINICAL DATA:  Palpitations. EXAM: CHEST - 2 VIEW COMPARISON:  None. FINDINGS: The heart size and mediastinal contours are within normal limits. Both lungs are clear. The visualized skeletal structures are unremarkable. IMPRESSION: Normal examination. Electronically Signed   By: Claudie Revering M.D.   On: 12/12/2017 19:16   Mr Jeri Cos And Wo Contrast  Result Date:  12/16/2017 CLINICAL DATA:  Acute onset cold sensation LEFT foot while watching television this afternoon, persistent foot numbness. EXAM: MRI HEAD WITHOUT AND WITH CONTRAST MRI CERVICAL SPINE WITHOUT AND WITH CONTRAST MRI THORACIC AND LUMBAR SPINE WITHOUT AND WITH CONTRAST TECHNIQUE: Multiplanar, multiecho pulse sequences of the brain and surrounding structures, and cervical spine, to include the craniocervical junction and cervicothoracic junction, were obtained without and with intravenous contrast. Multiplanar and multiecho pulse sequences of the thoracic and lumbar spine were obtained without and with intravenous contrast. CONTRAST:  54mL MULTIHANCE GADOBENATE DIMEGLUMINE 529 MG/ML IV SOLN COMPARISON:  CT HEAD Sep 17, 2016 FINDINGS: MRI BRAIN FINDINGS INTRACRANIAL CONTENTS: No reduced diffusion to suggest acute ischemia or hyperacute demyelination. No susceptibility artifact to suggest hemorrhage. The ventricles and sulci are normal for patient's age. No suspicious parenchymal signal, masses, mass effect. No abnormal intraparenchymal or extra-axial enhancement. No abnormal extra-axial fluid collections. No extra-axial masses. VASCULAR: Normal major intracranial vascular flow voids present at skull base. SKULL AND UPPER CERVICAL SPINE: No abnormal sellar expansion. No suspicious calvarial bone marrow signal. Craniocervical junction maintained. SINUSES/ORBITS: The mastoid air-cells and included paranasal sinuses are well-aerated.The included ocular globes and orbital contents are non-suspicious. OTHER: None. MRI CERVICAL SPINE FINDINGS ALIGNMENT: Maintained cervical lordosis.  No malalignment. VERTEBRAE/DISCS: Vertebral bodies are intact. Intervertebral disc morphology's and signal are normal. No abnormal or acute bone marrow signal. No abnormal osseous or disc enhancement. CORD:Cervical spinal cord is normal morphology and signal characteristics. No abnormal cord, leptomeningeal or epidural enhancement. POSTERIOR  FOSSA, VERTEBRAL ARTERIES, PARASPINAL TISSUES: No MR findings of ligamentous injury. Vertebral artery flow voids present. Included posterior fossa and paraspinal soft tissues are normal. DISC LEVELS: C2-3 through C7-T1: No disc bulge, canal stenosis nor neural foraminal narrowing. MRI THORACIC SPINE FINDINGS ALIGNMENT: Maintenance of the thoracic kyphosis. No malalignment. VERTEBRAE/DISCS: Vertebral bodies are intact. Intervertebral discs morphology and signal are normal. T1 and T2 bright benign hemangioma T8. Old T11 inferior endplate Schmorl's node. No abnormal or acute bone marrow signal. No abnormal osseous or disc enhancement. CORD: Thoracic spinal cord is normal morphology and signal characteristics. No abnormal cord, LEFT-dominant or epidural enhancement. PREVERTEBRAL AND PARASPINAL SOFT TISSUES:  Normal. DISC LEVELS: Small T7-8 RIGHT central disc protrusion contacting the ventral spinal cord with mild cord deformity, enhancing annular fissure. No canal stenosis or neural foraminal narrowing at any level. MRI LUMBAR SPINE FINDINGS SEGMENTATION: For the purposes of this report, the last well-formed intervertebral disc is reported as L5-S1. transitional anatomy with small S1-2 disc. ALIGNMENT: Maintained lumbar lordosis. No malalignment. Mild lower lumbar dextroscoliosis inferred on axial sequences. VERTEBRAE:Vertebral bodies are intact. Chronic LEFT S1 pars interarticularis defect. Intervertebral discs demonstrate normal morphology, mild desiccation L5-S1 disc. Mild chronic discogenic endplate changes B0-J6. Scattered old Schmorl's nodes. No abnormal or acute bone marrow signal. No abnormal osseous or disc enhancement. CONUS MEDULLARIS AND CAUDA EQUINA: Conus medullaris terminates at L1-2 and demonstrates normal morphology and signal characteristics. Cauda equina is normal. No abnormal cord, LEFT-dominant or epidural enhancement. PARASPINAL AND OTHER SOFT TISSUES:  Included prevertebral and paraspinal soft  tissues are normal. DISC LEVELS: L1-2 through L4-5: No disc bulge, canal stenosis nor neural foraminal narrowing. L5-S1: 4 mm central disc protrusion with enhancing annular fissure without traversing S1 impingement. Mild facet arthropathy. No canal stenosis or neural foraminal narrowing. IMPRESSION: MRI head: 1. Normal MRI of the head with and without contrast. MRI cervical spine: 1. Normal MRI of the cervical spine with and without contrast. MRI thoracic spine: 1. Small T7-8 disc protrusion contacting and deforming the ventral spinal cord. 2. Otherwise negative MRI of the thoracic spine with and without contrast. MRI lumbar spine: 1. L5-S1 disc protrusion and annular fissure. 2. Otherwise negative MRI of the lumbar spine with and without contrast. Electronically Signed   By: Elon Alas M.D.   On: 12/16/2017 21:23   Mr Cervical Spine W Or Wo Contrast  Result Date: 12/16/2017 CLINICAL DATA:  Acute onset cold sensation LEFT foot while watching television this afternoon, persistent foot numbness. EXAM: MRI HEAD WITHOUT AND WITH CONTRAST MRI CERVICAL SPINE WITHOUT AND WITH CONTRAST MRI THORACIC AND LUMBAR SPINE WITHOUT AND WITH CONTRAST TECHNIQUE: Multiplanar, multiecho pulse sequences of the brain and surrounding structures, and cervical spine, to include the craniocervical junction and cervicothoracic junction, were obtained without and with intravenous contrast. Multiplanar and multiecho pulse sequences of the thoracic and lumbar spine were obtained without and with intravenous contrast. CONTRAST:  30mL MULTIHANCE GADOBENATE DIMEGLUMINE 529 MG/ML IV SOLN COMPARISON:  CT HEAD Sep 17, 2016 FINDINGS: MRI BRAIN FINDINGS INTRACRANIAL CONTENTS: No reduced diffusion to suggest acute ischemia or hyperacute demyelination. No susceptibility artifact to suggest hemorrhage. The ventricles and sulci are normal for patient's age. No suspicious parenchymal signal, masses, mass effect. No abnormal intraparenchymal or  extra-axial enhancement. No abnormal extra-axial fluid collections. No extra-axial masses. VASCULAR: Normal major intracranial vascular flow voids present at skull base. SKULL AND UPPER CERVICAL SPINE: No abnormal sellar expansion. No suspicious calvarial bone marrow signal. Craniocervical junction maintained. SINUSES/ORBITS: The mastoid air-cells and included paranasal sinuses are well-aerated.The included ocular globes and orbital contents are non-suspicious. OTHER: None. MRI CERVICAL SPINE FINDINGS ALIGNMENT: Maintained cervical lordosis.  No malalignment. VERTEBRAE/DISCS: Vertebral bodies are intact. Intervertebral disc morphology's and signal are normal. No abnormal or acute bone marrow signal. No abnormal osseous or disc enhancement. CORD:Cervical spinal cord is normal morphology and signal characteristics. No abnormal cord, leptomeningeal or epidural enhancement. POSTERIOR FOSSA, VERTEBRAL ARTERIES, PARASPINAL TISSUES: No MR findings of ligamentous injury. Vertebral artery flow voids present. Included posterior fossa and paraspinal soft tissues are normal. DISC LEVELS: C2-3 through C7-T1: No disc bulge, canal stenosis nor neural foraminal narrowing. MRI THORACIC SPINE FINDINGS ALIGNMENT: Maintenance of the thoracic kyphosis. No malalignment. VERTEBRAE/DISCS: Vertebral bodies are intact. Intervertebral discs morphology and signal are normal. T1 and T2 bright benign hemangioma T8. Old T11 inferior endplate Schmorl's node. No abnormal or acute bone marrow signal. No abnormal osseous or disc enhancement. CORD: Thoracic spinal cord is normal morphology and signal characteristics. No abnormal cord, LEFT-dominant or epidural enhancement. PREVERTEBRAL AND PARASPINAL SOFT TISSUES:  Normal. DISC LEVELS: Small T7-8 RIGHT central disc protrusion contacting the ventral spinal cord with mild cord deformity, enhancing annular fissure. No canal stenosis or neural foraminal narrowing at any level. MRI LUMBAR SPINE FINDINGS  SEGMENTATION: For the purposes of this report, the last well-formed intervertebral disc is reported as L5-S1. transitional anatomy with small S1-2 disc. ALIGNMENT: Maintained lumbar lordosis. No malalignment. Mild lower lumbar dextroscoliosis inferred on axial sequences. VERTEBRAE:Vertebral bodies are intact. Chronic LEFT S1  pars interarticularis defect. Intervertebral discs demonstrate normal morphology, mild desiccation L5-S1 disc. Mild chronic discogenic endplate changes M8-U1. Scattered old Schmorl's nodes. No abnormal or acute bone marrow signal. No abnormal osseous or disc enhancement. CONUS MEDULLARIS AND CAUDA EQUINA: Conus medullaris terminates at L1-2 and demonstrates normal morphology and signal characteristics. Cauda equina is normal. No abnormal cord, LEFT-dominant or epidural enhancement. PARASPINAL AND OTHER SOFT TISSUES: Included prevertebral and paraspinal soft tissues are normal. DISC LEVELS: L1-2 through L4-5: No disc bulge, canal stenosis nor neural foraminal narrowing. L5-S1: 4 mm central disc protrusion with enhancing annular fissure without traversing S1 impingement. Mild facet arthropathy. No canal stenosis or neural foraminal narrowing. IMPRESSION: MRI head: 1. Normal MRI of the head with and without contrast. MRI cervical spine: 1. Normal MRI of the cervical spine with and without contrast. MRI thoracic spine: 1. Small T7-8 disc protrusion contacting and deforming the ventral spinal cord. 2. Otherwise negative MRI of the thoracic spine with and without contrast. MRI lumbar spine: 1. L5-S1 disc protrusion and annular fissure. 2. Otherwise negative MRI of the lumbar spine with and without contrast. Electronically Signed   By: Elon Alas M.D.   On: 12/16/2017 21:23   Mr Thoracic Spine W Wo Contrast  Result Date: 12/16/2017 CLINICAL DATA:  Acute onset cold sensation LEFT foot while watching television this afternoon, persistent foot numbness. EXAM: MRI HEAD WITHOUT AND WITH CONTRAST  MRI CERVICAL SPINE WITHOUT AND WITH CONTRAST MRI THORACIC AND LUMBAR SPINE WITHOUT AND WITH CONTRAST TECHNIQUE: Multiplanar, multiecho pulse sequences of the brain and surrounding structures, and cervical spine, to include the craniocervical junction and cervicothoracic junction, were obtained without and with intravenous contrast. Multiplanar and multiecho pulse sequences of the thoracic and lumbar spine were obtained without and with intravenous contrast. CONTRAST:  26mL MULTIHANCE GADOBENATE DIMEGLUMINE 529 MG/ML IV SOLN COMPARISON:  CT HEAD Sep 17, 2016 FINDINGS: MRI BRAIN FINDINGS INTRACRANIAL CONTENTS: No reduced diffusion to suggest acute ischemia or hyperacute demyelination. No susceptibility artifact to suggest hemorrhage. The ventricles and sulci are normal for patient's age. No suspicious parenchymal signal, masses, mass effect. No abnormal intraparenchymal or extra-axial enhancement. No abnormal extra-axial fluid collections. No extra-axial masses. VASCULAR: Normal major intracranial vascular flow voids present at skull base. SKULL AND UPPER CERVICAL SPINE: No abnormal sellar expansion. No suspicious calvarial bone marrow signal. Craniocervical junction maintained. SINUSES/ORBITS: The mastoid air-cells and included paranasal sinuses are well-aerated.The included ocular globes and orbital contents are non-suspicious. OTHER: None. MRI CERVICAL SPINE FINDINGS ALIGNMENT: Maintained cervical lordosis.  No malalignment. VERTEBRAE/DISCS: Vertebral bodies are intact. Intervertebral disc morphology's and signal are normal. No abnormal or acute bone marrow signal. No abnormal osseous or disc enhancement. CORD:Cervical spinal cord is normal morphology and signal characteristics. No abnormal cord, leptomeningeal or epidural enhancement. POSTERIOR FOSSA, VERTEBRAL ARTERIES, PARASPINAL TISSUES: No MR findings of ligamentous injury. Vertebral artery flow voids present. Included posterior fossa and paraspinal soft  tissues are normal. DISC LEVELS: C2-3 through C7-T1: No disc bulge, canal stenosis nor neural foraminal narrowing. MRI THORACIC SPINE FINDINGS ALIGNMENT: Maintenance of the thoracic kyphosis. No malalignment. VERTEBRAE/DISCS: Vertebral bodies are intact. Intervertebral discs morphology and signal are normal. T1 and T2 bright benign hemangioma T8. Old T11 inferior endplate Schmorl's node. No abnormal or acute bone marrow signal. No abnormal osseous or disc enhancement. CORD: Thoracic spinal cord is normal morphology and signal characteristics. No abnormal cord, LEFT-dominant or epidural enhancement. PREVERTEBRAL AND PARASPINAL SOFT TISSUES:  Normal. DISC LEVELS: Small T7-8 RIGHT central disc protrusion contacting the ventral  spinal cord with mild cord deformity, enhancing annular fissure. No canal stenosis or neural foraminal narrowing at any level. MRI LUMBAR SPINE FINDINGS SEGMENTATION: For the purposes of this report, the last well-formed intervertebral disc is reported as L5-S1. transitional anatomy with small S1-2 disc. ALIGNMENT: Maintained lumbar lordosis. No malalignment. Mild lower lumbar dextroscoliosis inferred on axial sequences. VERTEBRAE:Vertebral bodies are intact. Chronic LEFT S1 pars interarticularis defect. Intervertebral discs demonstrate normal morphology, mild desiccation L5-S1 disc. Mild chronic discogenic endplate changes T4-H9. Scattered old Schmorl's nodes. No abnormal or acute bone marrow signal. No abnormal osseous or disc enhancement. CONUS MEDULLARIS AND CAUDA EQUINA: Conus medullaris terminates at L1-2 and demonstrates normal morphology and signal characteristics. Cauda equina is normal. No abnormal cord, LEFT-dominant or epidural enhancement. PARASPINAL AND OTHER SOFT TISSUES: Included prevertebral and paraspinal soft tissues are normal. DISC LEVELS: L1-2 through L4-5: No disc bulge, canal stenosis nor neural foraminal narrowing. L5-S1: 4 mm central disc protrusion with enhancing  annular fissure without traversing S1 impingement. Mild facet arthropathy. No canal stenosis or neural foraminal narrowing. IMPRESSION: MRI head: 1. Normal MRI of the head with and without contrast. MRI cervical spine: 1. Normal MRI of the cervical spine with and without contrast. MRI thoracic spine: 1. Small T7-8 disc protrusion contacting and deforming the ventral spinal cord. 2. Otherwise negative MRI of the thoracic spine with and without contrast. MRI lumbar spine: 1. L5-S1 disc protrusion and annular fissure. 2. Otherwise negative MRI of the lumbar spine with and without contrast. Electronically Signed   By: Elon Alas M.D.   On: 12/16/2017 21:23   Mr Lumbar Spine W Wo Contrast  Result Date: 12/16/2017 CLINICAL DATA:  Acute onset cold sensation LEFT foot while watching television this afternoon, persistent foot numbness. EXAM: MRI HEAD WITHOUT AND WITH CONTRAST MRI CERVICAL SPINE WITHOUT AND WITH CONTRAST MRI THORACIC AND LUMBAR SPINE WITHOUT AND WITH CONTRAST TECHNIQUE: Multiplanar, multiecho pulse sequences of the brain and surrounding structures, and cervical spine, to include the craniocervical junction and cervicothoracic junction, were obtained without and with intravenous contrast. Multiplanar and multiecho pulse sequences of the thoracic and lumbar spine were obtained without and with intravenous contrast. CONTRAST:  57mL MULTIHANCE GADOBENATE DIMEGLUMINE 529 MG/ML IV SOLN COMPARISON:  CT HEAD Sep 17, 2016 FINDINGS: MRI BRAIN FINDINGS INTRACRANIAL CONTENTS: No reduced diffusion to suggest acute ischemia or hyperacute demyelination. No susceptibility artifact to suggest hemorrhage. The ventricles and sulci are normal for patient's age. No suspicious parenchymal signal, masses, mass effect. No abnormal intraparenchymal or extra-axial enhancement. No abnormal extra-axial fluid collections. No extra-axial masses. VASCULAR: Normal major intracranial vascular flow voids present at skull base. SKULL  AND UPPER CERVICAL SPINE: No abnormal sellar expansion. No suspicious calvarial bone marrow signal. Craniocervical junction maintained. SINUSES/ORBITS: The mastoid air-cells and included paranasal sinuses are well-aerated.The included ocular globes and orbital contents are non-suspicious. OTHER: None. MRI CERVICAL SPINE FINDINGS ALIGNMENT: Maintained cervical lordosis.  No malalignment. VERTEBRAE/DISCS: Vertebral bodies are intact. Intervertebral disc morphology's and signal are normal. No abnormal or acute bone marrow signal. No abnormal osseous or disc enhancement. CORD:Cervical spinal cord is normal morphology and signal characteristics. No abnormal cord, leptomeningeal or epidural enhancement. POSTERIOR FOSSA, VERTEBRAL ARTERIES, PARASPINAL TISSUES: No MR findings of ligamentous injury. Vertebral artery flow voids present. Included posterior fossa and paraspinal soft tissues are normal. DISC LEVELS: C2-3 through C7-T1: No disc bulge, canal stenosis nor neural foraminal narrowing. MRI THORACIC SPINE FINDINGS ALIGNMENT: Maintenance of the thoracic kyphosis. No malalignment. VERTEBRAE/DISCS: Vertebral bodies are intact. Intervertebral discs  morphology and signal are normal. T1 and T2 bright benign hemangioma T8. Old T11 inferior endplate Schmorl's node. No abnormal or acute bone marrow signal. No abnormal osseous or disc enhancement. CORD: Thoracic spinal cord is normal morphology and signal characteristics. No abnormal cord, LEFT-dominant or epidural enhancement. PREVERTEBRAL AND PARASPINAL SOFT TISSUES:  Normal. DISC LEVELS: Small T7-8 RIGHT central disc protrusion contacting the ventral spinal cord with mild cord deformity, enhancing annular fissure. No canal stenosis or neural foraminal narrowing at any level. MRI LUMBAR SPINE FINDINGS SEGMENTATION: For the purposes of this report, the last well-formed intervertebral disc is reported as L5-S1. transitional anatomy with small S1-2 disc. ALIGNMENT: Maintained  lumbar lordosis. No malalignment. Mild lower lumbar dextroscoliosis inferred on axial sequences. VERTEBRAE:Vertebral bodies are intact. Chronic LEFT S1 pars interarticularis defect. Intervertebral discs demonstrate normal morphology, mild desiccation L5-S1 disc. Mild chronic discogenic endplate changes N3-Z7. Scattered old Schmorl's nodes. No abnormal or acute bone marrow signal. No abnormal osseous or disc enhancement. CONUS MEDULLARIS AND CAUDA EQUINA: Conus medullaris terminates at L1-2 and demonstrates normal morphology and signal characteristics. Cauda equina is normal. No abnormal cord, LEFT-dominant or epidural enhancement. PARASPINAL AND OTHER SOFT TISSUES: Included prevertebral and paraspinal soft tissues are normal. DISC LEVELS: L1-2 through L4-5: No disc bulge, canal stenosis nor neural foraminal narrowing. L5-S1: 4 mm central disc protrusion with enhancing annular fissure without traversing S1 impingement. Mild facet arthropathy. No canal stenosis or neural foraminal narrowing. IMPRESSION: MRI head: 1. Normal MRI of the head with and without contrast. MRI cervical spine: 1. Normal MRI of the cervical spine with and without contrast. MRI thoracic spine: 1. Small T7-8 disc protrusion contacting and deforming the ventral spinal cord. 2. Otherwise negative MRI of the thoracic spine with and without contrast. MRI lumbar spine: 1. L5-S1 disc protrusion and annular fissure. 2. Otherwise negative MRI of the lumbar spine with and without contrast. Electronically Signed   By: Elon Alas M.D.   On: 12/16/2017 21:23    Microbiology: No results found for this or any previous visit (from the past 240 hour(s)).   Labs: Basic Metabolic Panel: Recent Labs  Lab 12/12/17 1820 12/17/17 0104 12/17/17 0243  NA 139 137 138  K 4.2 3.5 4.0  CL 108 104 104  CO2 23 22 23   GLUCOSE 95 92 97  BUN 14 10 10   CREATININE 0.93 0.91 1.01  CALCIUM 9.0 9.2 9.2   Liver Function Tests: No results for input(s):  AST, ALT, ALKPHOS, BILITOT, PROT, ALBUMIN in the last 168 hours. No results for input(s): LIPASE, AMYLASE in the last 168 hours. No results for input(s): AMMONIA in the last 168 hours. CBC: Recent Labs  Lab 12/12/17 1820 12/17/17 0104 12/17/17 0243  WBC 8.0 8.9 9.8  HGB 17.1* 17.4* 17.4*  HCT 49.7 49.0 48.5  MCV 87.0 84.3 84.2  PLT 300 246 249       Signed:  Oswald Hillock MD.  [facility] 12/18/2017, 8:38 AM

## 2017-12-18 NOTE — Progress Notes (Signed)
Pt given discharge instructions and gone over with him. Pt's belongings gathered to be sent home with him. Mom is driving him home. Pt in no distress at time of discharge.

## 2017-12-18 NOTE — Progress Notes (Signed)
Physical Therapy Treatment Patient Details Name: Ian Lewis MRN: [id number] DOB: [birth year] Today's Date: 12/18/2017    History of Present Illness Pt is a 27 y/o male admitted secondary to L sided weakness and numbness. Neuro was consulted and all imagining thus far has been negative. 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 Potential psych consult. No pertinent PMH.    PT Comments    Pt making steady progress with functional mobility and tolerated ambulating an increased distance this session. Pt also successfully completed stair training with min guard, progressing from using bilateral hand rails to just a single point cane. Pt with greatly improved strength in L LE with MMT revealing 5/5 for hip flexion, 5/5 for knee flexion, 5/5 for knee extension and 3/5 for ankle DF.   Pt would continue to benefit from skilled physical therapy services at this time while admitted and after d/c to address the below listed limitations in order to improve overall safety and independence with functional mobility.    Follow Up Recommendations  Outpatient PT     Equipment Recommendations  None recommended by PT    Recommendations for Other Services       Precautions / Restrictions Precautions Precautions: Fall Restrictions Weight Bearing Restrictions: No    Mobility  Bed Mobility Overal bed mobility: Modified Independent                Transfers Overall transfer level: Needs assistance Equipment used: Rolling walker (2 wheeled);Straight cane Transfers: Sit to/from Stand Sit to Stand: Supervision         General transfer comment: supervision for safety with sit<>stand x1 from EOB and x1 from toilet  Ambulation/Gait Ambulation/Gait assistance: Supervision Gait Distance (Feet): 100 Feet Assistive device: Rolling walker (2 wheeled) Gait Pattern/deviations: Decreased stride length;Decreased step length - right;Decreased step length - left;Decreased dorsiflexion - left Gait velocity:  decreased Gait velocity interpretation: 1.31 - 2.62 ft/sec, indicative of limited community ambulator General Gait Details: pt with greatly improved gait pattern today as compared to previous session; pt progressing from using RW to [facility] and using a more normal reciprocal gait pattern. Pt with minimal heel strike and toe-off on L but intermittently able to achieve a good heel strike   Stairs Stairs: Yes Stairs assistance: Min guard Stair Management: Two rails;Step to pattern;Forwards;No rails;With cane Number of Stairs: 2(x3 trials) General stair comments: pt initially ascending and descending two steps with bilateral hand rails x2 trials with min guard; pt progressing to use of single point cane and min guard for safety. pt with increased stability and less L LE deviation with descend when using SPC as compared to using bilateral hand rails   Wheelchair Mobility    Modified Rankin (Stroke Patients Only)       Balance Overall balance assessment: Needs assistance Sitting-balance support: Feet supported Sitting balance-Leahy Scale: Good     Standing balance support: No upper extremity supported Standing balance-Leahy Scale: Fair                              Cognition Arousal/Alertness: Awake/alert Behavior During Therapy: WFL for tasks assessed/performed Overall Cognitive Status: Within Functional Limits for tasks assessed                                        Exercises General Exercises - Lower Extremity Ankle Circles/Pumps: AROM;Both;10 reps;Seated Toe Raises: AROM;Strengthening;Both;10 reps;Standing Heel  Raises: AROM;Strengthening;Both;10 reps;Standing    General Comments        Pertinent Vitals/Pain Pain Assessment: No/denies pain    Home Living                      Prior Function            PT Goals (current goals can now be found in the care plan section) Acute Rehab PT Goals PT Goal Formulation: With patient Time For  Goal Achievement: 12/31/17 Potential to Achieve Goals: Good Progress towards PT goals: Progressing toward goals    Frequency    Min 3X/week      PT Plan Current plan remains appropriate    Co-evaluation              AM-PAC PT "6 Clicks" Daily Activity  Outcome Measure  Difficulty turning over in bed (including adjusting bedclothes, sheets and blankets)?: None Difficulty moving from lying on back to sitting on the side of the bed? : None Difficulty sitting down on and standing up from a chair with arms (e.g., wheelchair, bedside commode, etc,.)?: Unable Help needed moving to and from a bed to chair (including a wheelchair)?: None Help needed walking in hospital room?: A Little Help needed climbing 3-5 steps with a railing? : A Little 6 Click Score: 19    End of Session   Activity Tolerance: Patient tolerated treatment well Patient left: in bed;with call bell/phone within reach;with family/visitor present Nurse Communication: Mobility status PT Visit Diagnosis: Other abnormalities of gait and mobility (R26.89);Muscle weakness (generalized) (M62.81)     Time: 8916-9450 PT Time Calculation (min) (ACUTE ONLY): 16 min  Charges:  $Gait Training: 8-22 mins                     [place], [place], [place] 12/18/2017, 10:18 AM

## 2018-01-07 ENCOUNTER — Emergency Department (HOSPITAL_COMMUNITY)
Admission: EM | Admit: 2018-01-07 | Discharge: 2018-01-07 | Disposition: A | Discharge status: Home / Self Care | Payer: Self-pay | Attending: Emergency Medicine | Admitting: Emergency Medicine

## 2018-01-07 ENCOUNTER — Emergency Department (HOSPITAL_COMMUNITY)
Admission: EM | Admit: 2018-01-07 | Discharge: 2018-01-08 | Disposition: A | Discharge status: Home / Self Care | Payer: Self-pay | Attending: Emergency Medicine | Admitting: Emergency Medicine

## 2018-01-07 ENCOUNTER — Encounter (HOSPITAL_COMMUNITY): Payer: Self-pay | Admitting: Emergency Medicine

## 2018-01-07 ENCOUNTER — Other Ambulatory Visit: Payer: Self-pay

## 2018-01-07 ENCOUNTER — Emergency Department (HOSPITAL_COMMUNITY): Payer: Self-pay

## 2018-01-07 DIAGNOSIS — R002 Palpitations: Secondary | ICD-10-CM | POA: Insufficient documentation

## 2018-01-07 DIAGNOSIS — R55 Syncope and collapse: Secondary | ICD-10-CM | POA: Insufficient documentation

## 2018-01-07 DIAGNOSIS — Z79899 Other long term (current) drug therapy: Secondary | ICD-10-CM | POA: Insufficient documentation

## 2018-01-07 LAB — BASIC METABOLIC PANEL
ANION GAP: 7 (ref 5–15)
BUN: 12 mg/dL (ref 6–20)
CHLORIDE: 104 mmol/L (ref 98–111)
CO2: 27 mmol/L (ref 22–32)
Calcium: 9.7 mg/dL (ref 8.9–10.3)
Creatinine, Ser: 0.97 mg/dL (ref 0.61–1.24)
GFR calc Af Amer: >60 mL/min (ref >60)
GFR calc non Af Amer: >60 mL/min (ref >60)
GLUCOSE: 99 mg/dL (ref 70–99)
POTASSIUM: 4.2 mmol/L (ref 3.5–5.1)
Sodium: 138 mmol/L (ref 135–145)

## 2018-01-07 LAB — CBC
HEMATOCRIT: 48.9 % (ref 39.0–52.0)
HEMOGLOBIN: 17.2 g/dL — AB (ref 13.0–17.0)
MCH: 30.2 pg (ref 26.0–34.0)
MCHC: 35.2 g/dL (ref 30.0–36.0)
MCV: 85.9 fL (ref 78.0–100.0)
Platelets: 240 10*3/uL (ref 150–400)
RBC: 5.69 MIL/uL (ref 4.22–5.81)
RDW: 12.2 % (ref 11.5–15.5)
WBC: 5.7 10*3/uL (ref 4.0–10.5)

## 2018-01-07 LAB — I-STAT TROPONIN, ED: Troponin i, poc: 0.02 ng/mL (ref 0.00–0.08)

## 2018-01-07 NOTE — ED Provider Notes (Signed)
[facility] EMERGENCY DEPARTMENT Provider Note   CSN: [id number] Arrival date & time: 01/07/18  2129     History   Chief Complaint Chief Complaint  Patient presents with  . Chest Pain    HPI Ian Lewis is a 27 y.o. male.   27 year old male with a history of likely conversion disorder (see admission discharge from 12/16/17) presents to the emergency department for recurrent near syncope.  Was seen earlier today for similar complaints.  States that he got home and felt a "stretching" pain in his central chest around 1900.  This was associated with palpitations characterized as a fluctuating heart rate.  States that he felt dizzy as well as slightly nauseous.  He did not have any complete syncope.  Is feeling better currently.  Was told to follow-up with cardiology, but was concerned about symptom recurrence so he returned to the emergency department for repeat evaluation.     History reviewed. No pertinent past medical history.  Patient Active Problem List   Diagnosis Date Noted  . Lower extremity weakness 12/17/2017    Past Surgical History:  Procedure Laterality Date  . KNEE SURGERY    . MANDIBLE FRACTURE SURGERY          Home Medications    Prior to Admission medications   Medication Sig Start Date End Date Taking? Authorizing Provider  acetaminophen (TYLENOL) 500 MG tablet Take 1,000 mg by mouth every 6 (six) hours as needed.    [provider]  loratadine (CLARITIN) 10 MG tablet Take 20 mg by mouth daily as needed for allergies.    [provider]  Multiple Vitamins-Minerals (MULTIVITAMINS THER. W/MINERALS) TABS Take 1 tablet by mouth daily.      [provider]    Family History Family History  Problem Relation Age of Onset  . Atrial fibrillation Father     Social History Social History   Tobacco Use  . Smoking status: Never Smoker  . Smokeless tobacco: Never Used  Substance Use Topics  . Alcohol use:  No  . Drug use: No     Allergies   Lorabid [loracarbef]   Review of Systems Review of Systems Ten systems reviewed and are negative for acute change, except as noted in the HPI.    Physical Exam Updated Vital Signs BP 112/75   Pulse 63   Temp 98.3 F (36.8 C) (Oral)   Resp 20   SpO2 96%   Physical Exam  Constitutional: He is oriented to person, place, and time. He appears well-developed and well-nourished. No distress.  Nontoxic appearing and in NAD  HENT:  Head: Normocephalic and atraumatic.  Eyes: Conjunctivae and EOM are normal. No scleral icterus.  Neck: Normal range of motion.  Cardiovascular: Normal rate, regular rhythm and intact distal pulses.  Pulmonary/Chest: Effort normal. No stridor. No respiratory distress. He has no wheezes. He has no rales.  Lungs CTAB. Respirations even and unlabored.  Musculoskeletal: Normal range of motion.  No BLE edema.  Neurological: He is alert and oriented to person, place, and time. He exhibits normal muscle tone. Coordination normal.  Skin: Skin is warm and dry. No rash noted. He is not diaphoretic. No erythema. No pallor.  Psychiatric: He has a normal mood and affect. His behavior is normal.  Nursing note and vitals reviewed.    ED Treatments / Results  Labs (all labs ordered are listed, but only abnormal results are displayed) Labs Reviewed  D-DIMER, QUANTITATIVE (NOT AT [facility])  I-STAT  TROPONIN, ED    EKG EKG Interpretation  Date/Time:  Wednesday January 07 2018 21:36:16 EDT Ventricular Rate:  106 PR Interval:  128 QRS Duration: 78 QT Interval:  330 QTC Calculation: 438 R Axis:   76 Text Interpretation:  Sinus tachycardia Otherwise normal ECG Since last tracing rate faster Confirmed by Wandra Arthurs [phone]) on 01/07/2018 10:56:46 PM   Radiology Dg Chest 2 View  Result Date: 01/07/2018 CLINICAL DATA:  Chest pain, shortness of breath EXAM: CHEST - 2 VIEW COMPARISON:  12/12/2017 FINDINGS: Heart and mediastinal  contours are within normal limits. No focal opacities or effusions. No acute bony abnormality. IMPRESSION: No active cardiopulmonary disease. Electronically Signed   By: Rolm Baptise M.D.   On: 01/07/2018 22:16    Procedures Procedures (including critical care time)  Medications Ordered in ED Medications - No data to display   Initial Impression / Assessment and Plan / ED Course  I have reviewed the triage vital signs and the nursing notes.  Pertinent labs & imaging results that were available during my care of the patient were reviewed by me and considered in my medical decision making (see chart for details).     15:73 PM 27 year old male with likely conversion disorder presents to the emergency department for the second time today regarding near syncopal event.  Suspect that his symptoms are driven by anxiety.  Was noted to be tachycardic on arrival, so will obtain d-dimer.  Troponin today is negative.  Chest x-ray completed as this was not evaluated earlier.  There is no evidence of acute cardiopulmonary abnormality.    Physical exam is reassuring.  Feel that cardiogenic etiology of symptoms is less likely given persistent prodromal symptoms.  Plan for discharge with continued outpatient follow up if dimer negative.  12:23 AM D-dimer negative.  No concern for pulmonary embolus.  Tachycardia has normalized.  Vitals reassuring.  Patient stable for discharge with continued outpatient follow-up.  Return precautions discussed and provided. Patient discharged in stable condition with no unaddressed concerns.   Final Clinical Impressions(s) / ED Diagnoses   Final diagnoses:  Near syncope  Palpitations    ED Discharge Orders    None       Antonietta Breach, PA-C 01/08/18 0024    Drenda Freeze, MD 01/10/18 2215

## 2018-01-07 NOTE — Discharge Instructions (Addendum)
Please read attached information. If you experience any new or worsening signs or symptoms please return to the emergency room for evaluation. Please follow-up with your primary care provider or specialist as discussed.  °

## 2018-01-07 NOTE — ED Triage Notes (Signed)
Arrived via EMS patient walking into work and felt like he was going to pass out lightheaded. Sat self down. EMS reported CBG 113. Alert answering and following commands appropriate. EMS report sinus arrhythmia which patient has a history of.

## 2018-01-07 NOTE — ED Triage Notes (Signed)
Pt states he was seen earlier today for syncopal episode.  Instructed to follow-up with cardiology.  Reports feeling heart racing and like chest is "stretching" since 7pm with mild nausea.

## 2018-01-07 NOTE — ED Provider Notes (Signed)
[facility] EMERGENCY DEPARTMENT Provider Note   CSN: [id number] Arrival date & time: 01/07/18  1124    History   Chief Complaint Chief Complaint  Patient presents with  . Near Syncope    HPI Ian Lewis is a 27 y.o. male.  HPI   78 YOF presents today with complaints of nursing be. Patient notes a significant past medical history the same. He reports that he has been on many Holter monitors had tilt table testing, stress test, with no acute diagnosis. Patient notes he was walking work today felt extremely weak and felt his heart racing and felt as if he was given a pass out. He notes he needed to work and was able to sit down. He called EMS who brought him to the emergency room. Patient notes that he is been eating and drinking appropriately, he notes that his heart is no longer racing. He denies any significant family cardiac history other than a father with atrial fibrillation.  History reviewed. No pertinent past medical history.  Patient Active Problem List   Diagnosis Date Noted  . Lower extremity weakness 12/17/2017    Past Surgical History:  Procedure Laterality Date  . KNEE SURGERY    . MANDIBLE FRACTURE SURGERY          Home Medications    Prior to Admission medications   Medication Sig Start Date End Date Taking? Authorizing Provider  acetaminophen (TYLENOL) 500 MG tablet Take 1,000 mg by mouth every 6 (six) hours as needed.    [provider]  loratadine (CLARITIN) 10 MG tablet Take 20 mg by mouth daily as needed for allergies.    [provider]  Multiple Vitamins-Minerals (MULTIVITAMINS THER. W/MINERALS) TABS Take 1 tablet by mouth daily.      [provider]    Family History Family History  Problem Relation Age of Onset  . Atrial fibrillation Father     Social History Social History   Tobacco Use  . Smoking status: Never Smoker  . Smokeless tobacco: Never Used  Substance Use Topics  . Alcohol  use: No  . Drug use: No     Allergies   Lorabid [loracarbef]   Review of Systems Review of Systems  All other systems reviewed and are negative.    Physical Exam Updated Vital Signs BP 125/88   Pulse 69   Temp 98.6 F (37 C) (Oral)   Resp 16   Ht 5\' 6"  (1.676 m)   Wt 80.3 kg   SpO2 100%   BMI 28.57 kg/m   Physical Exam  Constitutional: He is oriented to person, place, and time. He appears well-developed and well-nourished.  HENT:  Head: Normocephalic and atraumatic.  Eyes: Pupils are equal, round, and reactive to light. Conjunctivae are normal. Right eye exhibits no discharge. Left eye exhibits no discharge. No scleral icterus.  Neck: Normal range of motion. No JVD present. No tracheal deviation present.  Cardiovascular: Normal rate, regular rhythm, normal heart sounds and intact distal pulses. Exam reveals no gallop and no friction rub.  No murmur heard. Pulmonary/Chest: Effort normal and breath sounds normal. No stridor. No respiratory distress. He has no wheezes. He has no rales. He exhibits no tenderness.  Neurological: He is alert and oriented to person, place, and time. Coordination normal.  Psychiatric: He has a normal mood and affect. His behavior is normal. Judgment and thought content normal.  Nursing note and vitals reviewed.    ED Treatments / Results  Labs (  all labs ordered are listed, but only abnormal results are displayed) Labs Reviewed  CBC - Abnormal; Notable for the following components:      Result Value   Hemoglobin 17.2 (*)    All other components within normal limits  BASIC METABOLIC PANEL  URINALYSIS, ROUTINE W REFLEX MICROSCOPIC  CBG MONITORING, ED    EKG None  Radiology No results found.  Procedures Procedures (including critical care time)  Medications Ordered in ED Medications - No data to display   Initial Impression / Assessment and Plan / ED Course  I have reviewed the triage vital signs and the nursing  notes.  Pertinent labs & imaging results that were available during my care of the patient were reviewed by me and considered in my medical decision making (see chart for details).     Labs: BMP, CBC  Imaging:ED EKG-  Normal sinus rhythm 82 bpm- no ST elevation or depression  Consults:  Therapeutics:  Discharge Meds:   Assessment/Plan: 27 year old male presents today with near syncopal episode. Well-appearing in no acute distress. He has normal heart sounds, reassuring EKG. Patient has had numerous hospitalizations and workup for similar complaints with no significant etiology. I have low suspicion for acute cardiac etiology although I will have him follow-up with cardiology for reevaluation. Patient given strict return percussion's, he verbalized understanding and agreement to today's plan had no further questions or concerns.   Final Clinical Impressions(s) / ED Diagnoses   Final diagnoses:  Near syncope    ED Discharge Orders    None       Francee Gentile 01/07/18 [place], MD 01/07/18 2128

## 2018-01-07 NOTE — ED Notes (Signed)
U/A needs recollection d/t specimen (sent down with requisition form) not having patient label.

## 2018-01-08 LAB — D-DIMER, QUANTITATIVE (NOT AT ARMC)

## 2018-01-08 NOTE — Discharge Instructions (Signed)
Your work-up in the emergency department has been reassuring.  Continue with outpatient follow-up as previously advised.  You may return for new or concerning symptoms.

## 2018-01-21 ENCOUNTER — Ambulatory Visit: Payer: Self-pay | Admitting: Neurology

## 2018-01-21 ENCOUNTER — Encounter (HOSPITAL_COMMUNITY): Payer: Self-pay

## 2018-01-21 ENCOUNTER — Emergency Department (HOSPITAL_COMMUNITY)
Admission: EM | Admit: 2018-01-21 | Discharge: 2018-01-21 | Disposition: A | Discharge status: Home / Self Care | Payer: Self-pay | Attending: Emergency Medicine | Admitting: Emergency Medicine

## 2018-01-21 DIAGNOSIS — R55 Syncope and collapse: Secondary | ICD-10-CM | POA: Insufficient documentation

## 2018-01-21 DIAGNOSIS — Z79899 Other long term (current) drug therapy: Secondary | ICD-10-CM | POA: Insufficient documentation

## 2018-01-21 NOTE — ED Provider Notes (Signed)
[name] EMERGENCY DEPARTMENT Provider Note   CSN: [id number] Arrival date & time: 01/21/18  1129     History   Chief Complaint Chief Complaint  Patient presents with  . Loss of Consciousness    HPI Ian Lewis is a 27 y.o. male senting for evaluation of loss of consciousness and possible seizure.  Patient states he was at work when he had a syncopal episode.  States loss of consciousness lasted 20 minutes.  Patient states he was at work in the next that he knows EMS was standing over him.  Patient states EMS told him he was having a seizure because his eyes were twitching.  He denies incontinence.  Patient reports initial tingling of his left arm, this has since resolved.  He reports no symptoms right now other than generalized tiredness, which he states is normal.  Patient states he is had multiple of these episodes, extensive work-up with no obvious cause.  Patient states that each episode is precipitated by feeling like his heart is racing.  He has an appointment cardiology scheduled next month.  Patient currently reports no feelings of palpitations, chest pain, shortness of breath.  He denies recent fevers, chills, cough, nausea, vomiting, abdominal pain, urinary symptoms, abnormal bowel movements.  He states he ate cereal and oatmeal today, has had multiple glasses of water.  No recent medication changes.  Additional history obtained from chart review.  Patient recently admitted to the hospital for left leg numbness which was eventually diagnosed as conversion disorder.  He has had significant work-up including MRI of the brain and his entire spine and recent CT all of which were negative.  Patient has been seen in the ER multiple times for syncope, all with normal findings.  HPI  History reviewed. No pertinent past medical history.  Patient Active Problem List   Diagnosis Date Noted  . Lower extremity weakness 12/17/2017    Past Surgical History:    Procedure Laterality Date  . KNEE SURGERY    . MANDIBLE FRACTURE SURGERY          Home Medications    Prior to Admission medications   Medication Sig Start Date End Date Taking? Authorizing Provider  acetaminophen (TYLENOL) 500 MG tablet Take 1,000 mg by mouth every 6 (six) hours as needed.    [provider]  loratadine (CLARITIN) 10 MG tablet Take 20 mg by mouth daily as needed for allergies.    [provider]  Multiple Vitamins-Minerals (MULTIVITAMINS THER. W/MINERALS) TABS Take 1 tablet by mouth daily.      [provider]    Family History Family History  Problem Relation Age of Onset  . Atrial fibrillation Father     Social History Social History   Tobacco Use  . Smoking status: Never Smoker  . Smokeless tobacco: Never Used  Substance Use Topics  . Alcohol use: No  . Drug use: No     Allergies   Lorabid [loracarbef]   Review of Systems Review of Systems  Cardiovascular: Positive for palpitations (Resolved).  Neurological: Positive for seizures (Possible, eye twitching) and syncope.  All other systems reviewed and are negative.    Physical Exam Updated Vital Signs BP 106/66 (BP Location: Right Arm)   Pulse 80   Temp 98.4 F (36.9 C) (Oral)   Resp 16   Ht 5\' 6"  (1.676 m)   Wt 77.1 kg   SpO2 97%   BMI 27.44 kg/m   Physical Exam  Constitutional: He  is oriented to person, place, and time. He appears well-developed and well-nourished. No distress.  Resting comfortably in the bed in no acute distress  HENT:  Head: Normocephalic and atraumatic.  No obvious head injury.  Eyes: Pupils are equal, round, and reactive to light. Conjunctivae and EOM are normal.  EOMI and PERRLA.  No nystagmus.  Neck: Normal range of motion. Neck supple.  No tenderness palpation of midline C-spine.  Moving head easily and exam.  Cardiovascular: Normal rate, regular rhythm and intact distal pulses.  Regular rate rhythm.  No tachycardia or PVCs  noted.  Pulmonary/Chest: Effort normal and breath sounds normal. No respiratory distress. He has no wheezes.  Speaking in full sentences.  Clear lung sounds in all fields.  Abdominal: Soft. He exhibits no distension and no mass. There is no tenderness. There is no guarding.  Musculoskeletal: Normal range of motion.  Strength intact x4.  Sensation intact x4.  Radial pedal pulses intact bilaterally.  Soft compartments.  Neurological: He is alert and oriented to person, place, and time. He displays normal reflexes. No cranial nerve deficit or sensory deficit.  No obvious neurologic deficits.  CN intact.  Nose to finger intact.  Grip strength intact.  Fine movement and coronation intact.  Skin: Skin is warm and dry. Capillary refill takes less than 2 seconds.  Psychiatric: He has a normal mood and affect.  Nursing note and vitals reviewed.    ED Treatments / Results  Labs (all labs ordered are listed, but only abnormal results are displayed) Labs Reviewed - No data to display  EKG EKG Interpretation  Date/Time:  Wednesday January 21 2018 11:47:51 EDT Ventricular Rate:  76 PR Interval:  118 QRS Duration: 78 QT Interval:  352 QTC Calculation: 396 R Axis:   80 Text Interpretation:  Normal sinus rhythm with sinus arrhythmia Normal ECG Confirmed by Lennice Sites [phone]) on 01/21/2018 12:31:47 PM   Radiology No results found.  Procedures Procedures (including critical care time)  Medications Ordered in ED Medications - No data to display   Initial Impression / Assessment and Plan / ED Course  I have reviewed the triage vital signs and the nursing notes.  Pertinent labs & imaging results that were available during my care of the patient were reviewed by me and considered in my medical decision making (see chart for details).     Pt presenting for evaluation of syncopal event and possible seizure.  Physical exam reassuring, no obvious neurologic deficits.  Patient is afebrile  not tachycardic, appears nontoxic.  EKG non-concerning, no STEMI.  Symptoms improved, patient without palpitations or tingling at this time.  Patient states he has been worked up for this multiple times in the past, does not want to wait for another work-up.  I discussed with patient that there could be something new going on, which could be revealed in blood work, urine, and imaging, but patient declines.  Patient states he is an appointment cardiology, will follow up regarding his heart racing.  Discussed with patient that while I am not sure that he actually had a seizure, as he did not have any incontinence or tonic-clonic movements of his extremities, will give seizure precautions including no driving or operating heavy machinery.  Discussed with attending, Dr. Ronnald Nian agrees to plan.  As patient would like to leave without further evaluation, patient will be discharged.  At this time, patient appears safe for discharge.  Return precautions given.  Patient states he understands and agrees plan.  Final  Clinical Impressions(s) / ED Diagnoses   Final diagnoses:  Syncope and collapse    ED Discharge Orders    None       Franchot Heidelberg, PA-C 01/21/18 1633    Lennice Sites, DO 01/21/18 2119

## 2018-01-21 NOTE — Discharge Instructions (Addendum)
As you may have had a seizure today, do not drive or operate heavy machinery in the next 6 months or until cleared by primary care or neurology. Make sure you are staying well-hydrated with water. Follow-up for your cardiology appointment as scheduled. Return to the emergency room with any new, worsening, or concerning symptoms.

## 2018-01-21 NOTE — ED Triage Notes (Addendum)
Pt BIB [facility] d/t syncope episode at work. LOC lasted approx 10 mins. Per EMS, staff stated he had convulsions. Pt had L.arm paresthesia  that resolved after episode.Per EMS Int'l SBPs was in the 200s

## 2018-01-23 ENCOUNTER — Encounter: Payer: Self-pay | Admitting: Cardiology

## 2018-01-23 ENCOUNTER — Ambulatory Visit (INDEPENDENT_AMBULATORY_CARE_PROVIDER_SITE_OTHER): Payer: Self-pay | Admitting: Cardiology

## 2018-01-23 VITALS — BP 152/78 | HR 75 | Ht 66.0 in | Wt 179.0 lb

## 2018-01-23 DIAGNOSIS — R55 Syncope and collapse: Secondary | ICD-10-CM

## 2018-01-23 DIAGNOSIS — R002 Palpitations: Secondary | ICD-10-CM

## 2018-01-23 HISTORY — DX: Palpitations: R00.2

## 2018-01-23 NOTE — Progress Notes (Signed)
Cardiology Office Note:    Date:  01/23/2018   ID:  Ian Lewis, DOB August 02, 1990, MRN [id number]  PCP:  Patient, No Pcp Per  Cardiologist:  Jenean Lindau, MD   Referring MD: No ref. provider found    ASSESSMENT:    1. Syncope, unspecified syncope type   2. Palpitations    PLAN:    In order of problems listed above:  1. I discussed my findings with the patient at extensive length.  I will get records from his cardiologist office in [place] to assess the evaluation.  He is having significant symptoms.  I would prefer him to get a loop recorder.  For this reason I will refer him to our fellow electrophysiologist.  The patient is young and has significant symptoms and he has not yet had a diagnosis of what arrhythmia this could be.  It appears like a supraventricular tachycaa based on his symptom profile.  Further recommendations will depend on the findings and evaluation by electrophysiology colleagues.Patient will be seen in follow-up appointment in 6 months or earlier if the patient has any concerns 2. He knows to go to the nearest emergency room for any concerning symptoms.   Medication Adjustments/Labs and Tests Ordered: Current medicines are reviewed at length with the patient today.  Concerns regarding medicines are outlined above.  Orders Placed This Encounter  Procedures  . Ambulatory referral to Cardiac Electrophysiology   No orders of the defined types were placed in this encounter.    History of Present Illness:    Ian Lewis is a 27 y.o. male who is being seen today for the evaluation of palpitations and syncope.  Patient is a pleasant 27 year old male with medical history of palpitations.  He recently had significant palpitations and passed out.  He mentions to me that he has been evaluated by a cardiologist for these issues and a Holter monitoring and echocardiogram was done.  His monitoring was for 2 weeks.  This was unremarkable.  Subsequently he moved  here.  He was working the other day and had significant palpitations and had a syncopal spell.  He went to the emergency room and was found to be fine.  He is here for this evaluation.  At the time of my evaluation, the patient is alert awake oriented and in no distress. Past Medical History:  Diagnosis Date  . Lower extremity weakness 12/17/2017    Past Surgical History:  Procedure Laterality Date  . KNEE SURGERY    . MANDIBLE FRACTURE SURGERY      Current Medications: Current Meds  Medication Sig  . acetaminophen (TYLENOL) 500 MG tablet Take 1,000 mg by mouth every 6 (six) hours as needed.  . loratadine (CLARITIN) 10 MG tablet Take 20 mg by mouth daily as needed for allergies.  . Multiple Vitamins-Minerals (MULTIVITAMINS THER. W/MINERALS) TABS Take 1 tablet by mouth daily.       Allergies:   Lorabid [loracarbef]   Social History   Socioeconomic History  . Marital status: Single    Spouse name: Not on file  . Number of children: Not on file  . Years of education: Not on file  . Highest education level: Not on file  Occupational History  . Not on file  Social Needs  . Financial resource strain: Not on file  . Food insecurity:    Worry: Not on file    Inability: Not on file  . Transportation needs:    Medical: Not on file  Non-medical: Not on file  Tobacco Use  . Smoking status: Never Smoker  . Smokeless tobacco: Never Used  Substance and Sexual Activity  . Alcohol use: No  . Drug use: No  . Sexual activity: Not on file  Lifestyle  . Physical activity:    Days per week: Not on file    Minutes per session: Not on file  . Stress: Not on file  Relationships  . Social connections:    Talks on phone: Not on file    Gets together: Not on file    Attends religious service: Not on file    Active member of club or organization: Not on file    Attends meetings of clubs or organizations: Not on file    Relationship status: Not on file  Other Topics Concern  . Not on  file  Social History Narrative  . Not on file     Family History: The patient's family history includes Atrial fibrillation in his father.  ROS:   Please see the history of present illness.    All other systems reviewed and are negative.  EKGs/Labs/Other Studies Reviewed:    The following studies were reviewed today: I reviewed emergency room records.  EKG is unremarkable and blood work was fine to including TSH.   Recent Labs: 12/17/2017: TSH 2.972 01/07/2018: BUN 12; Creatinine, Ser 0.97; Hemoglobin 17.2; Platelets 240; Potassium 4.2; Sodium 138  Recent Lipid Panel No results found for: CHOL, TRIG, HDL, CHOLHDL, VLDL, LDLCALC, LDLDIRECT  Physical Exam:    VS:  BP (!) 152/78 (BP Location: Right Arm, Patient Position: Sitting, Cuff Size: Normal)   Pulse 75   Ht 5\' 6"  (1.676 m)   Wt 179 lb (81.2 kg)   SpO2 99%   BMI 28.89 kg/m     Wt Readings from Last 3 Encounters:  01/23/18 179 lb (81.2 kg)  01/21/18 170 lb (77.1 kg)  01/07/18 177 lb (80.3 kg)     GEN: Patient is in no acute distress HEENT: Normal NECK: No JVD; No carotid bruits LYMPHATICS: No lymphadenopathy CARDIAC: S1 S2 regular, 2/6 systolic murmur at the apex. RESPIRATORY:  Clear to auscultation without rales, wheezing or rhonchi  ABDOMEN: Soft, non-tender, non-distended MUSCULOSKELETAL:  No edema; No deformity  SKIN: Warm and dry NEUROLOGIC:  Alert and oriented x 3 PSYCHIATRIC:  Normal affect    Signed, Jenean Lindau, MD  01/23/2018 12:01 PM    [facility]

## 2018-01-23 NOTE — Patient Instructions (Signed)
Medication Instructions:  Your physician recommends that you continue on your current medications as directed. Please refer to the Current Medication list given to you today.  Labwork: None  Testing/Procedures: None  Follow-Up: Your physician recommends that you schedule a follow-up appointment in: 6 months  Any Other Special Instructions Will Be Listed Below (If Applicable).   Referral Orders     Ambulatory referral to Cardiac Electrophysiology   If you need a refill on your cardiac medications before your next appointment, please call your pharmacy.   [place], RN, BSN

## 2018-01-30 ENCOUNTER — Telehealth: Payer: Self-pay | Admitting: Cardiology

## 2018-01-30 ENCOUNTER — Encounter: Payer: Self-pay | Admitting: Cardiology

## 2018-01-30 ENCOUNTER — Ambulatory Visit (INDEPENDENT_AMBULATORY_CARE_PROVIDER_SITE_OTHER): Payer: Self-pay | Admitting: Cardiology

## 2018-01-30 VITALS — BP 134/82 | HR 77 | Ht 66.0 in | Wt 180.0 lb

## 2018-01-30 DIAGNOSIS — R002 Palpitations: Secondary | ICD-10-CM

## 2018-01-30 NOTE — Progress Notes (Signed)
Electrophysiology Office Note   Date:  01/30/2018   ID:  Ian Lewis, DOB [birth year], MRN [id number]  PCP:  Patient, No Pcp Per  Cardiologist:  Revankar Primary Electrophysiologist:  Will Meredith Leeds, MD    No chief complaint on file.    History of Present Illness: Ian Lewis is a 27 y.o. male who is being seen today for the evaluation of syncope at the request of Sunny Schlein Revankar. Presenting today for electrophysiology evaluation.  He has a history of palpitations.  He recently had an episode of palpitations where he had an syncope.  He has been evaluated by a cardiologist with Holter monitoring in the past.  An echocardiogram was also done.  His monitoring was for 2 weeks.  This was found to be unremarkable.  Earlier this month he had an episode of palpitations and syncope.  He went to the emergency room which showed no arrhythmias.  He recently moved home from [place].  He was having palpitations there.  He had a 14-day monitor that showed no arrhythmias.  He also has had a stress test table test without abnormality.  Today, he denies symptoms of palpitations, chest pain, shortness of breath, orthopnea, PND, lower extremity edema, claudication, dizziness, presyncope, syncope, bleeding, or neurologic sequela. The patient is tolerating medications without difficulties.    Past Medical History:  Diagnosis Date  . Lower extremity weakness 12/17/2017   Past Surgical History:  Procedure Laterality Date  . KNEE SURGERY    . MANDIBLE FRACTURE SURGERY       Current Outpatient Medications  Medication Sig Dispense Refill  . acetaminophen (TYLENOL) 500 MG tablet Take 1,000 mg by mouth every 6 (six) hours as needed.    . loratadine (CLARITIN) 10 MG tablet Take 20 mg by mouth daily as needed for allergies.    . Multiple Vitamins-Minerals (MULTIVITAMINS THER. W/MINERALS) TABS Take 1 tablet by mouth daily.       No current facility-administered medications for this visit.      Allergies:   Lorabid [loracarbef]   Social History:  The patient  reports that he has never smoked. He has never used smokeless tobacco. He reports that he does not drink alcohol or use drugs.   Family History:  The patient's family history includes Atrial fibrillation in his father.    ROS:  Please see the history of present illness.   Otherwise, review of systems is positive for palpitations.   All other systems are reviewed and negative.    PHYSICAL EXAM: VS:  BP 134/82   Pulse 77   Ht 5\' 6"  (1.676 m)   Wt 180 lb (81.6 kg)   SpO2 99%   BMI 29.05 kg/m  , BMI Body mass index is 29.05 kg/m. GEN: Well nourished, well developed, in no acute distress  HEENT: normal  Neck: no JVD, carotid bruits, or masses Cardiac: RRR; no murmurs, rubs, or gallops,no edema  Respiratory:  clear to auscultation bilaterally, normal work of breathing GI: soft, nontender, nondistended, + BS MS: no deformity or atrophy  Skin: warm and dry Neuro:  Strength and sensation are intact Psych: euthymic mood, full affect  EKG:  EKG is not ordered today. Personal review of the ekg ordered 01/21/18 shows sinus arrhythmia, rate 76, shortened PR interval 118 ms  Recent Labs: 12/17/2017: TSH 2.972 01/07/2018: BUN 12; Creatinine, Ser 0.97; Hemoglobin 17.2; Platelets 240; Potassium 4.2; Sodium 138    Lipid Panel  No results found for: CHOL, TRIG, HDL, CHOLHDL, VLDL, LDLCALC,  LDLDIRECT   Wt Readings from Last 3 Encounters:  01/30/18 180 lb (81.6 kg)  01/23/18 179 lb (81.2 kg)  01/21/18 170 lb (77.1 kg)      Other studies Reviewed: Additional studies/ records that were reviewed today include: epic notes   ASSESSMENT AND PLAN:  1.  Palpitations: Associated with possible episodes of syncope.  He has had an extensive evaluation in [place] with a 14-day monitor as well as tilt table testing and stress testing.  These have shown no issues.  At this point, due to the infrequency of his symptoms, we will  plan for Linq monitoring.  Risks and benefits were discussed and include bleeding and infection.  The patient understands risks and is agreed to the procedure.   Current medicines are reviewed at length with the patient today.   The patient does not have concerns regarding his medicines.  The following changes were made today:  none  Labs/ tests ordered today include:  No orders of the defined types were placed in this encounter.  Case discussed with primary cardiology  Disposition:   FU with Will Camnitz PRN pending link results  Signed, Will Meredith Leeds, MD  01/30/2018 4:14 PM     [place] [address] [phone] (office) [phone] (fax)

## 2018-01-30 NOTE — Patient Instructions (Addendum)
Medication Instructions:  Your physician recommends that you continue on your current medications as directed. Please refer to the Current Medication list given to you today.  * If you need a refill on your cardiac medications before your next appointment, please call your pharmacy.   Labwork: None ordered *We will only notify you of abnormal results, otherwise continue current treatment plan.  Testing/Procedures: Your physician has recommended that you have a loop recorder implanted.  Please arrive at [facility] main entrance at 12:30 pm   Follow-Up: Your physician recommends that you schedule a follow-up appointment in: 7-10 days, after your procedure on 02/05/18, with device clinic for a wound check.  No follow up is needed at this time with Dr. Curt Bears.  He will see you on an as needed basis.   *Please note that any paperwork needing to be filled out by the provider will need to be addressed at the front desk prior to seeing the provider. Please note that any FMLA, disability or other documents regarding health condition is subject to a $25.00 charge that must be received prior to completion of paperwork in the form of a money order or check.  Thank you for choosing [facility]!!   Trinidad Curet, RN [phone]  Any Other Special Instructions Will Be Listed Below (If Applicable).

## 2018-02-03 ENCOUNTER — Ambulatory Visit: Payer: Self-pay | Admitting: Cardiology

## 2018-02-05 ENCOUNTER — Ambulatory Visit (HOSPITAL_COMMUNITY)
Admission: RE | Admit: 2018-02-05 | Discharge: 2018-02-05 | Disposition: A | Discharge status: Home / Self Care | Payer: Self-pay | Source: Ambulatory Visit | Attending: Cardiology | Admitting: Cardiology

## 2018-02-05 ENCOUNTER — Encounter (HOSPITAL_COMMUNITY)
Admission: RE | Disposition: A | Discharge status: Home / Self Care | Payer: Self-pay | Source: Ambulatory Visit | Attending: Cardiology

## 2018-02-05 DIAGNOSIS — R55 Syncope and collapse: Secondary | ICD-10-CM

## 2018-02-05 DIAGNOSIS — R002 Palpitations: Secondary | ICD-10-CM | POA: Insufficient documentation

## 2018-02-05 DIAGNOSIS — Z9889 Other specified postprocedural states: Secondary | ICD-10-CM | POA: Insufficient documentation

## 2018-02-05 DIAGNOSIS — Z8249 Family history of ischemic heart disease and other diseases of the circulatory system: Secondary | ICD-10-CM | POA: Insufficient documentation

## 2018-02-05 DIAGNOSIS — Z79899 Other long term (current) drug therapy: Secondary | ICD-10-CM | POA: Insufficient documentation

## 2018-02-05 DIAGNOSIS — R531 Weakness: Secondary | ICD-10-CM | POA: Insufficient documentation

## 2018-02-05 DIAGNOSIS — Z888 Allergy status to other drugs, medicaments and biological substances status: Secondary | ICD-10-CM | POA: Insufficient documentation

## 2018-02-05 HISTORY — PX: LOOP RECORDER INSERTION: EP1214

## 2018-02-05 SURGERY — LOOP RECORDER INSERTION
Anesthesia: LOCAL

## 2018-02-05 MED ORDER — LIDOCAINE-EPINEPHRINE 1 %-1:100000 IJ SOLN
INTRAMUSCULAR | Status: AC
  Filled 2018-02-05: qty 1

## 2018-02-05 MED ORDER — LIDOCAINE-EPINEPHRINE 0.5 %-1:200000 IJ SOLN
INTRAMUSCULAR | Status: DC | PRN
  Administered 2018-02-05: 15 mL

## 2018-02-05 SURGICAL SUPPLY — 2 items
LOOP REVEAL LINQSYS (Prosthesis & Implant Heart) ×2 IMPLANT
PACK LOOP INSERTION (CUSTOM PROCEDURE TRAY) ×2 IMPLANT

## 2018-02-05 NOTE — Discharge Instructions (Signed)
See loose paper hand out.

## 2018-02-05 NOTE — H&P (Signed)
Ian Lewis has presented today for surgery, with the diagnosis of palpitations.  The various methods of treatment have been discussed with the patient and family. After consideration of risks, benefits and other options for treatment, the patient has consented to  Procedure(s): LINQ implant as a surgical intervention .  Risks include but not limited to bleeding, tamponade, infection, pneumothorax, among others. The patient's history has been reviewed, patient examined, no change in status, stable for surgery.  I have reviewed the patient's chart and labs.  Questions were answered to the patient's satisfaction.    Ian Lewis Ian Bears, MD 02/05/2018 1:48 PM

## 2018-02-06 ENCOUNTER — Encounter (HOSPITAL_COMMUNITY): Payer: Self-pay | Admitting: Cardiology

## 2018-02-12 ENCOUNTER — Encounter: Payer: Self-pay | Admitting: *Deleted

## 2018-02-12 ENCOUNTER — Ambulatory Visit (INDEPENDENT_AMBULATORY_CARE_PROVIDER_SITE_OTHER): Payer: Self-pay | Admitting: *Deleted

## 2018-02-12 VITALS — BP 138/82 | HR 75

## 2018-02-12 DIAGNOSIS — R55 Syncope and collapse: Secondary | ICD-10-CM

## 2018-02-12 DIAGNOSIS — R002 Palpitations: Secondary | ICD-10-CM

## 2018-02-12 LAB — CUP PACEART INCLINIC DEVICE CHECK
Implantable Pulse Generator Implant Date: 20190926
MDC IDC SESS DTM: 20191003105839

## 2018-02-12 NOTE — Progress Notes (Signed)
Wound check appointment. Steri-strips removed. Incision edges superficially unapproximated, no signs/symptoms of infection. Reapplied steri-strips and instructed patient to keep site dry x3-4 days. Wound recheck on 02/18/18 while WC is in the office.   Normal loop recorder function. Battery status: good. R-waves 1.58mV. No tachy, brady, pause, or AF episodes. 2 symptom episodes--episode #1 from 10/1 suggests SR/ST, patient reports experiencing left hand and arm numbness, burning, and tingling, as well as lightheadedness x2 min. Episode #2 occurred today while patient was in the lobby. Symptoms began with palpitations, then left hand numbness and tingling extending to left chest, followed by dizziness while patient walking back to the exam room. No syncope, symptoms gradually abated. Episode ECG shows SR to ST, followed by return to SR. BP 138/82, HR 75 once checked in exam room. Reviewed symptoms and ECG with Dr. Marlou Porch (DOD), who recommended patient liberalize salt and fluid intake. Patient verbalizes understanding of recommendations, states he will try drinking some Gatorade. Monthly summary reports and ROV with WC TBD.  Note routed to Dr. Curt Bears for review and f/u recommendations.

## 2018-02-12 NOTE — Patient Instructions (Signed)
Increase your salt and fluid intake per Dr. Marlou Porch.  Wound recheck on Wednesday, 02/18/18 at 2:00pm.

## 2018-02-16 ENCOUNTER — Emergency Department (HOSPITAL_COMMUNITY)
Admission: EM | Admit: 2018-02-16 | Discharge: 2018-02-16 | Disposition: A | Discharge status: Home / Self Care | Payer: Self-pay | Attending: Emergency Medicine | Admitting: Emergency Medicine

## 2018-02-16 ENCOUNTER — Encounter (HOSPITAL_COMMUNITY): Payer: Self-pay | Admitting: Emergency Medicine

## 2018-02-16 ENCOUNTER — Emergency Department (HOSPITAL_COMMUNITY): Payer: Self-pay

## 2018-02-16 DIAGNOSIS — Z95818 Presence of other cardiac implants and grafts: Secondary | ICD-10-CM | POA: Insufficient documentation

## 2018-02-16 DIAGNOSIS — R002 Palpitations: Secondary | ICD-10-CM | POA: Insufficient documentation

## 2018-02-16 DIAGNOSIS — R55 Syncope and collapse: Secondary | ICD-10-CM | POA: Insufficient documentation

## 2018-02-16 DIAGNOSIS — R42 Dizziness and giddiness: Secondary | ICD-10-CM | POA: Insufficient documentation

## 2018-02-16 LAB — CBC
HCT: 46.6 % (ref 39.0–52.0)
Hemoglobin: 16.7 g/dL (ref 13.0–17.0)
MCH: 30.7 pg (ref 26.0–34.0)
MCHC: 35.8 g/dL (ref 30.0–36.0)
MCV: 85.7 fL (ref 78.0–100.0)
PLATELETS: 256 10*3/uL (ref 150–400)
RBC: 5.44 MIL/uL (ref 4.22–5.81)
RDW: 12.5 % (ref 11.5–15.5)
WBC: 7.7 10*3/uL (ref 4.0–10.5)

## 2018-02-16 LAB — BASIC METABOLIC PANEL
ANION GAP: 9 (ref 5–15)
BUN: 16 mg/dL (ref 6–20)
CHLORIDE: 106 mmol/L (ref 98–111)
CO2: 25 mmol/L (ref 22–32)
Calcium: 9 mg/dL (ref 8.9–10.3)
Creatinine, Ser: 0.98 mg/dL (ref 0.61–1.24)
GFR calc non Af Amer: >60 mL/min (ref >60)
Glucose, Bld: 101 mg/dL — ABNORMAL HIGH (ref 70–99)
Potassium: 3.7 mmol/L (ref 3.5–5.1)
SODIUM: 140 mmol/L (ref 135–145)

## 2018-02-16 LAB — I-STAT TROPONIN, ED: Troponin i, poc: 0 ng/mL (ref 0.00–0.08)

## 2018-02-16 MED ORDER — SODIUM CHLORIDE 0.9 % IV SOLN: INTRAVENOUS | Status: DC

## 2018-02-16 MED ORDER — SODIUM CHLORIDE 0.9 % IV BOLUS
1000.0000 mL | Freq: Once | INTRAVENOUS | Status: AC
  Administered 2018-02-16: 1000 mL via INTRAVENOUS

## 2018-02-16 NOTE — ED Notes (Signed)
Consult to Cardiology 2nd time @20 :25.

## 2018-02-16 NOTE — ED Triage Notes (Addendum)
Pt reports that at work this morning had heart palpitations and lightheadedness and left arm tingling and weakness.  Reports got a Loop recorder placed 2 weeks ago.  Pt c/o headache at this time

## 2018-02-16 NOTE — Discharge Instructions (Addendum)
Called your cardiologist to schedule a follow-up visit

## 2018-02-16 NOTE — ED Provider Notes (Signed)
[facility] DEPT Provider Note   CSN: [id number] Arrival date & time: 02/16/18  1439     History   Chief Complaint Chief Complaint  Patient presents with  . Palpitations  . Dizziness  . Headache    HPI Ian Lewis is a 27 y.o. male.  27 year old male with history of palpitations with recent loop recorder implanted 2 weeks ago presents with worsening symptoms of palpitations with dizziness and lightheadedness.  Notes near syncope without actual syncope.  No chest pain or shortness of breath with this.  Symptoms wax and wane and nothing makes them better worse.  Denies any current medication use or use of caffeine or illicit drugs.      Past Medical History:  Diagnosis Date  . Lower extremity weakness 12/17/2017    Patient Active Problem List   Diagnosis Date Noted  . Palpitations 01/23/2018  . Syncope 01/23/2018  . Lower extremity weakness 12/17/2017    Past Surgical History:  Procedure Laterality Date  . KNEE SURGERY    . LOOP RECORDER INSERTION N/A 02/05/2018   Procedure: LOOP RECORDER INSERTION;  Surgeon: Constance Haw, MD;  Location: [facility];  Service: Cardiovascular;  Laterality: N/A;  . MANDIBLE FRACTURE SURGERY          Home Medications    Prior to Admission medications   Medication Sig Start Date End Date Taking? Authorizing Provider  acetaminophen (TYLENOL) 500 MG tablet Take 1,000 mg by mouth every 6 (six) hours as needed.    [provider]  loratadine (CLARITIN) 10 MG tablet Take 20 mg by mouth daily as needed for allergies.    [provider]    Family History Family History  Problem Relation Age of Onset  . Atrial fibrillation Father     Social History Social History   Tobacco Use  . Smoking status: Never Smoker  . Smokeless tobacco: Never Used  Substance Use Topics  . Alcohol use: No  . Drug use: No     Allergies   Lorabid [loracarbef]   Review of  Systems Review of Systems  All other systems reviewed and are negative.    Physical Exam Updated Vital Signs BP (!) 143/87 (BP Location: Left Arm)   Pulse (!) 121   Temp 98.7 F (37.1 C) (Oral)   Resp 14   Ht 1.676 m (5\' 6" )   Wt 81.6 kg   SpO2 98%   BMI 29.05 kg/m   Physical Exam  Constitutional: He is oriented to person, place, and time. He appears well-developed and well-nourished.  Non-toxic appearance. No distress.  HENT:  Head: Normocephalic and atraumatic.  Eyes: Pupils are equal, round, and reactive to light. Conjunctivae, EOM and lids are normal.  Neck: Normal range of motion. Neck supple. No tracheal deviation present. No thyroid mass present.  Cardiovascular: Normal rate, regular rhythm and normal heart sounds. Exam reveals no gallop.  No murmur heard. Pulmonary/Chest: Effort normal and breath sounds normal. No stridor. No respiratory distress. He has no decreased breath sounds. He has no wheezes. He has no rhonchi. He has no rales.  Abdominal: Soft. Normal appearance and bowel sounds are normal. He exhibits no distension. There is no tenderness. There is no rebound and no CVA tenderness.  Musculoskeletal: Normal range of motion. He exhibits no edema or tenderness.  Neurological: He is alert and oriented to person, place, and time. He has normal strength. No cranial nerve deficit or sensory deficit. GCS eye subscore is  4. GCS verbal subscore is 5. GCS motor subscore is 6.  Skin: Skin is warm and dry. No abrasion and no rash noted.  Psychiatric: He has a normal mood and affect. His speech is normal and behavior is normal.  Nursing note and vitals reviewed.    ED Treatments / Results  Labs (all labs ordered are listed, but only abnormal results are displayed) Labs Reviewed  BASIC METABOLIC PANEL  CBC  I-STAT TROPONIN, ED    EKG EKG Interpretation  Date/Time:  Monday February 16 2018 14:52:00 EDT Ventricular Rate:  113 PR Interval:    QRS Duration: 85 QT  Interval:  317 QTC Calculation: 435 R Axis:   75 Text Interpretation:  Sinus tachycardia Confirmed by Lacretia Leigh ([id number]) on 02/16/2018 3:35:45 PM   Radiology Dg Chest 2 View  Result Date: 02/16/2018 CLINICAL DATA:  Heart palpitations, left arm tingling and weakness and lightheadedness. EXAM: CHEST - 2 VIEW COMPARISON:  01/07/2018. FINDINGS: The heart size and mediastinal contours are within normal limits. Both lungs are clear. The visualized skeletal structures are unremarkable. IMPRESSION: Normal examination. Electronically Signed   By: Claudie Revering M.D.   On: 02/16/2018 15:20    Procedures Procedures (including critical care time)  Medications Ordered in ED Medications  sodium chloride 0.9 % bolus 1,000 mL (has no administration in time range)  0.9 %  sodium chloride infusion (has no administration in time range)     Initial Impression / Assessment and Plan / ED Course  I have reviewed the triage vital signs and the nursing notes.  Pertinent labs & imaging results that were available during my care of the patient were reviewed by me and considered in my medical decision making (see chart for details).     Labs here are reassuring.  Patient's device was interrogated and no events were noted.  Case have been discussed with cardiology and plan was that if patient did not have any events of his device that he could be safely discharged.  Patient encouraged to follow with his cardiologist.  Final Clinical Impressions(s) / ED Diagnoses   Final diagnoses:  None    ED Discharge Orders    None       Lacretia Leigh, MD 02/16/18 2146

## 2018-02-18 ENCOUNTER — Ambulatory Visit (INDEPENDENT_AMBULATORY_CARE_PROVIDER_SITE_OTHER): Payer: Self-pay | Admitting: *Deleted

## 2018-02-18 DIAGNOSIS — R002 Palpitations: Secondary | ICD-10-CM

## 2018-02-18 NOTE — Progress Notes (Signed)
Patient presents to the office for a wound recheck s/p ILR implant on 02/05/18. Wound healing well without redness or edema. Scab noted at incision - WC assessed wound, no changes made. Instructed patient to call for any signs/symptoms of infection. Patient verbalized understanding. Patient will follow up with Dr.Camnitz in 3 months (per [facility] recommendation).

## 2018-02-23 ENCOUNTER — Encounter (HOSPITAL_COMMUNITY): Payer: Self-pay

## 2018-02-23 ENCOUNTER — Emergency Department (HOSPITAL_COMMUNITY)
Admission: EM | Admit: 2018-02-23 | Discharge: 2018-02-23 | Disposition: A | Discharge status: Home / Self Care | Payer: Self-pay | Attending: Emergency Medicine | Admitting: Emergency Medicine

## 2018-02-23 ENCOUNTER — Telehealth: Payer: Self-pay | Admitting: Cardiology

## 2018-02-23 ENCOUNTER — Other Ambulatory Visit: Payer: Self-pay

## 2018-02-23 ENCOUNTER — Emergency Department (HOSPITAL_COMMUNITY): Payer: Self-pay

## 2018-02-23 DIAGNOSIS — R000 Tachycardia, unspecified: Secondary | ICD-10-CM

## 2018-02-23 DIAGNOSIS — R002 Palpitations: Secondary | ICD-10-CM | POA: Insufficient documentation

## 2018-02-23 DIAGNOSIS — R079 Chest pain, unspecified: Secondary | ICD-10-CM | POA: Insufficient documentation

## 2018-02-23 LAB — RAPID URINE DRUG SCREEN, HOSP PERFORMED
Amphetamines: NOT DETECTED
BARBITURATES: NOT DETECTED
Benzodiazepines: NOT DETECTED
Cocaine: NOT DETECTED
Opiates: NOT DETECTED
Tetrahydrocannabinol: NOT DETECTED

## 2018-02-23 LAB — LIPASE, BLOOD: Lipase: 32 U/L (ref 11–51)

## 2018-02-23 LAB — ACETAMINOPHEN LEVEL

## 2018-02-23 LAB — COMPREHENSIVE METABOLIC PANEL
ALBUMIN: 4.2 g/dL (ref 3.5–5.0)
ALK PHOS: 59 U/L (ref 38–126)
ALT: 14 U/L (ref 0–44)
ANION GAP: 8 (ref 5–15)
AST: 42 U/L — ABNORMAL HIGH (ref 15–41)
BUN: 11 mg/dL (ref 6–20)
CALCIUM: 9.2 mg/dL (ref 8.9–10.3)
CHLORIDE: 106 mmol/L (ref 98–111)
CO2: 25 mmol/L (ref 22–32)
Creatinine, Ser: 0.99 mg/dL (ref 0.61–1.24)
GFR calc non Af Amer: >60 mL/min (ref >60)
Glucose, Bld: 89 mg/dL (ref 70–99)
POTASSIUM: 5.1 mmol/L (ref 3.5–5.1)
SODIUM: 139 mmol/L (ref 135–145)
Total Bilirubin: 1.9 mg/dL — ABNORMAL HIGH (ref 0.3–1.2)
Total Protein: 6.7 g/dL (ref 6.5–8.1)

## 2018-02-23 LAB — I-STAT TROPONIN, ED
TROPONIN I, POC: 0 ng/mL (ref 0.00–0.08)
TROPONIN I, POC: 0 ng/mL (ref 0.00–0.08)

## 2018-02-23 LAB — CBC
HCT: 47.8 % (ref 39.0–52.0)
Hemoglobin: 16.8 g/dL (ref 13.0–17.0)
MCH: 29.7 pg (ref 26.0–34.0)
MCHC: 35.1 g/dL (ref 30.0–36.0)
MCV: 84.6 fL (ref 80.0–100.0)
NRBC: 0 % (ref 0.0–0.2)
PLATELETS: 272 10*3/uL (ref 150–400)
RBC: 5.65 MIL/uL (ref 4.22–5.81)
RDW: 12.1 % (ref 11.5–15.5)
WBC: 8.8 10*3/uL (ref 4.0–10.5)

## 2018-02-23 LAB — SALICYLATE LEVEL

## 2018-02-23 LAB — IRON: Iron: 168 ug/dL (ref 45–182)

## 2018-02-23 MED ORDER — ACETAMINOPHEN 325 MG PO TABS: 650.0000 mg | ORAL_TABLET | Freq: Once | ORAL | Status: DC

## 2018-02-23 MED ORDER — SODIUM CHLORIDE 0.9 % IV BOLUS
1000.0000 mL | Freq: Once | INTRAVENOUS | Status: AC
  Administered 2018-02-23: 1000 mL via INTRAVENOUS

## 2018-02-23 MED ORDER — KETOROLAC TROMETHAMINE 15 MG/ML IJ SOLN: 15.0000 mg | Freq: Once | INTRAMUSCULAR | Status: DC

## 2018-02-23 NOTE — Telephone Encounter (Signed)
Patient called and stated that he used his symptom activator b/c he was experiencing shortness of breath, dizziness, and chest pain. Pt wanted to know if he should go the the hospital w/ [place] [facility]. Call routed to [place]

## 2018-02-23 NOTE — ED Notes (Signed)
Followed up with Ian Lewis at poison control. Updated with lab values. No further reccommendations at this time.

## 2018-02-23 NOTE — Telephone Encounter (Signed)
Patient reports he was at work when he felt his heart racing and felt dizzy and like he may pass out. He did use his symptom activator. He decided to go home from work but on the way he felt like he may pass out again, he then made his way to a fire department who then called EMS. He continues to feel light headed and his heart racing. He used his symptom activator again. Without a transmission at this time I have no information to correlate with his symptoms. He reports that Dr. Curt Bears advised him to call our office before proceeding to the ER. I have advised him that he should not drive and that if he continues to feel presyncopal he should go to the ER. He verbalizes understanding.

## 2018-02-23 NOTE — Consult Note (Addendum)
Cardiology Consultation:   Patient ID: Ian Lewis MRN: [id number]; DOB: 08-03-1990  Admit date: 02/23/2018 Date of Consult: 02/23/2018  Primary Care Provider: Constance Haw, MD Primary Cardiologist:   Ravankar  Primary Electrophysiologist:  Curt Bears    Patient Profile:   Ian Lewis is a 27 y.o. male with a hx of sinus tachycardia  who is being seen today for the evaluation of  Chest pain  at the request of  Dr. Francia Greaves .  History of Present Illness:   Ian Lewis is a 27 year old gentleman who has a history of syncope.  He was originally seen by Dr. Geraldo Pitter.  He was seen by Dr. Curt Bears and had an implantable loop recorder placed.  He has had a month or so of palpitations and syncope.  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  HR was fast and then slow.   Developed a pain in his chest  Was told to go home,  Felt dizzy while driving  Pulled over and eventually went to the fire station  Has occasional flushing symptoms with the tachycardia    No fever, no blood in urine or stool  Does not drink energy driinks.   Drinks gatorade   Some nausea, no vomitting . Some discomfort in lower abdomen No diarrhea recently  Slight blurry vision at work  No blurry vision now   Received 1 liter of IV fluids, - does not feel any different  ( all VS are stable at this poihnt )   Non smoker,  No drugs No etoh Fam Hx- father has atrial fib and HTN       Past Medical History:  Diagnosis Date  . Lower extremity weakness 12/17/2017    Past Surgical History:  Procedure Laterality Date  . KNEE SURGERY    . LOOP RECORDER INSERTION N/A 02/05/2018   Procedure: LOOP RECORDER INSERTION;  Surgeon: Constance Haw, MD;   Location: [facility];  Service: Cardiovascular;  Laterality: N/A;  . MANDIBLE FRACTURE SURGERY       Home Medications:  Prior to Admission medications   Medication Sig Start Date End Date Taking? Authorizing Provider  ibuprofen (ADVIL,MOTRIN) 200 MG tablet Take 800 mg by mouth every 6 (six) hours as needed for fever or headache.   Yes [provider]    Inpatient Medications: Scheduled Meds: . ketorolac  15 mg Intravenous Once   Continuous Infusions:  PRN Meds:   Allergies:    Allergies  Allergen Reactions  . Lorabid [Loracarbef] Hives    Social History:   Social History   Socioeconomic History  . Marital status: Single    Spouse name: Not on file  . Number of children: Not on file  . Years of education: Not on file  . Highest education level: Not on file  Occupational History  . Not on file  Social Needs  . Financial resource strain: Not on file  . Food insecurity:    Worry: Not on file    Inability: Not on file  . Transportation needs:    Medical: Not on file    Non-medical: Not on file  Tobacco Use  .  Smoking status: Never Smoker  . Smokeless tobacco: Never Used  Substance and Sexual Activity  . Alcohol use: No  . Drug use: No  . Sexual activity: Not on file  Lifestyle  . Physical activity:    Days per week: Not on file    Minutes per session: Not on file  . Stress: Not on file  Relationships  . Social connections:    Talks on phone: Not on file    Gets together: Not on file    Attends religious service: Not on file    Active member of club or organization: Not on file    Attends meetings of clubs or organizations: Not on file    Relationship status: Not on file  . Intimate partner violence:    Fear of current or ex partner: Not on file    Emotionally abused: Not on file    Physically abused: Not on file    Forced sexual activity: Not on file  Other Topics Concern  . Not on file  Social History Narrative  . Not on file      Family History:    Family History  Problem Relation Age of Onset  . Atrial fibrillation Father      ROS:  Please see the history of present illness.   All other ROS reviewed and negative.     Physical Exam/Data:   Vitals:   02/23/18 1545 02/23/18 1600 02/23/18 1615 02/23/18 1630  BP: 113/77 113/71 126/89 108/75  Pulse: 85 72 75 88  Resp: 16 12 13 16   SpO2: 98% 98% 93% 96%   No intake or output data in the 24 hours ending 02/23/18 1826 There were no vitals filed for this visit. There is no height or weight on file to calculate BMI.  General:  Well nourished, well developed, in no acute distress, anxious  HEENT: normal Lymph: no adenopathy Neck: no JVD Endocrine:  No thryomegaly Vascular: No carotid bruits; FA pulses 2+ bilaterally without bruits  Cardiac:  normal S1, S2; RRR; no murmur  Lungs:  clear to auscultation bilaterally, no wheezing, rhonchi or rales  Abd: Mildly tender in his left upper quadrant. Ext: no edema Musculoskeletal:  No deformities, BUE and BLE strength normal and equal Skin: warm and dry  Neuro:  CNs 2-12 intact, no focal abnormalities noted Psych:  Normal affect   EKG:  The EKG was personally reviewed and demonstrates:   NSR wiotjh no ST or T wave changes  Telemetry:  Telemetry was personally reviewed and demonstrates:   Sinus tach with NSR  Also episodes of marked sinus arrhythmia   Relevant CV Studies:   Laboratory Data:  Chemistry Recent Labs  Lab 02/23/18 1536  NA 139  K 5.1  CL 106  CO2 25  GLUCOSE 89  BUN 11  CREATININE 0.99  CALCIUM 9.2  GFRNONAA >60  GFRAA >60  ANIONGAP 8    Recent Labs  Lab 02/23/18 1536  PROT 6.7  ALBUMIN 4.2  AST 42*  ALT 14  ALKPHOS 59  BILITOT 1.9*   Hematology Recent Labs  Lab 02/23/18 1536  WBC 8.8  RBC 5.65  HGB 16.8  HCT 47.8  MCV 84.6  MCH 29.7  MCHC 35.1  RDW 12.1  PLT 272   Cardiac EnzymesNo results for input(s): TROPONINI in the last 168 hours.  Recent Labs  Lab  02/23/18 1542  TROPIPOC 0.00    BNPNo results for input(s): BNP, PROBNP in the last 168 hours.  DDimer  No results for input(s): DDIMER in the last 168 hours.  Radiology/Studies:  Dg Chest 2 View  Result Date: 02/23/2018 CLINICAL DATA:  Left lower chest pain EXAM: CHEST - 2 VIEW COMPARISON:  02/16/2018 FINDINGS: Normal heart size and mediastinal contours. Implantable loop recorder. There is no edema, consolidation, effusion, or pneumothorax. Artifact from EKG leads. IMPRESSION: No evidence of active disease. Electronically Signed   By: Monte Fantasia M.D.   On: 02/23/2018 16:20    Assessment and Plan:   1. Episodic tachycardia. Does not occur every day - usually if he is work or up walking around  Seems to occur more frequently at work. Telemetry strip from EMS reveals marked sinus arrhythmia.  There is no severe episodes of tachycardia or bradycardia. Loop recorder shows  A persistent HR of 140s just prior to his activation of the symptom button .    Associated with flushing  ? carcinoiod syndrome ? Pheochromocytoma Needs an echo 24 hour urine studies as OP     Was too nauseated to eat lunch today  Some of this may have been due to volume depletion. Denies drug use,   At this point, there is no emergent cardiac issue that needs inpatient hospitalization He needs further evaluation as noted above - echo, 24 hour urine studies,  Further eval of implantable loop recorder   Will have him return to see Dr.  Basilio Cairo and Dr. Curt Bears in the office  2.  Abdominal pain :   His complaints of chest pain are actually abdominal pain.  It is left upper quadrant pain.  We will have the emergency room evaluate that further. His AST is mildly elevated. Nausea today.  There is no vomiting.       For questions or updates, please contact [facility] Please consult [email or web address] for contact info under     Signed, Mertie Moores, MD  02/23/2018 6:26 PM

## 2018-02-23 NOTE — ED Notes (Signed)
Spoke with [employer] from poison control. Neither medication (goody powder or iron) is at a dose that is toxic to pt. Recommend serum iron, acetaminophen, AST ALT, Salicylate level

## 2018-02-23 NOTE — ED Provider Notes (Signed)
[facility] EMERGENCY DEPARTMENT Provider Note   CSN: [id number] Arrival date & time: 02/23/18  1522     History   Chief Complaint Chief Complaint  Patient presents with  . Chest Pain  . Dizziness    HPI Ian Lewis is a 27 y.o. male with a hx of prior palpitations with loop recorder device insertion 02/05/18 who arrives to the ED with complaint of palpitations that started at 13:30. Patient reports he was out for a delivery for work when he developed palpitations described as his HR speeding up/slowing down. He states with this he had waxing/waning generalized weakness with some lightheadedness and nausea with feeling as if he may pass out-no syncope occurred. He states that with these sxs around 14:30 he developed some L sided chest/upper abdominal discomfort described as pressure/aching which has been fairly constant since onset. This is alleviated/aggravated with certain positions. He also notes some mild dyspena and anxiety which are resolved at present. Currently he is feeling somewhat better, but remains generally weak. He was seen in the ED for similar last week- discharged home. Today his discomfort seemed to last longer and he could not manually interpret his loop recorder due to being away from home prompting ER visit.hx of similar sxs.  Denies syncope, fever, chills, cough,  leg pain/swelling, hemoptysis, recent surgery/trauma (other than loop recorder insertion), recent long travel, hormone use, personal hx of cancer, or hx of DVT/PE. Denies family hx of sudden cardiac death.  States that he ate breakfast, did not eat lunch, only had 2 glasses of water today.  Denies substance abuse.  HPI  Past Medical History:  Diagnosis Date  . Lower extremity weakness 12/17/2017    Patient Active Problem List   Diagnosis Date Noted  . Palpitations 01/23/2018  . Syncope 01/23/2018  . Lower extremity weakness 12/17/2017    Past Surgical History:  Procedure  Laterality Date  . KNEE SURGERY    . LOOP RECORDER INSERTION N/A 02/05/2018   Procedure: LOOP RECORDER INSERTION;  Surgeon: Constance Haw, MD;  Location: [facility];  Service: Cardiovascular;  Laterality: N/A;  . MANDIBLE FRACTURE SURGERY          Home Medications    Prior to Admission medications   Not on File    Family History Family History  Problem Relation Age of Onset  . Atrial fibrillation Father     Social History Social History   Tobacco Use  . Smoking status: Never Smoker  . Smokeless tobacco: Never Used  Substance Use Topics  . Alcohol use: No  . Drug use: No     Allergies   Lorabid [loracarbef]   Review of Systems Review of Systems  Constitutional: Negative for chills and fever.  Respiratory: Positive for shortness of breath (resolved at present). Negative for cough and wheezing.   Cardiovascular: Positive for chest pain and palpitations. Negative for leg swelling.  Gastrointestinal: Positive for abdominal pain and nausea. Negative for blood in stool, constipation, diarrhea and vomiting.  Neurological: Positive for light-headedness. Negative for syncope, weakness and numbness.  All other systems reviewed and are negative.    Physical Exam Updated Vital Signs Blood pressure 123/82, pulse 98, temperature 97.8 F (36.6 C), temperature source Oral, resp. rate 18, SpO2 99 %. Physical Exam  Constitutional: He appears well-developed and well-nourished.  Non-toxic appearance. No distress.  HENT:  Head: Normocephalic and atraumatic.  Eyes: Conjunctivae are normal. Right eye exhibits no discharge. Left eye exhibits no discharge.  Neck: Neck supple.  Cardiovascular: Normal rate and regular rhythm.  Pulses:      Radial pulses are 2+ on the right side, and 2+ on the left side.       Posterior tibial pulses are 2+ on the right side, and 2+ on the left side.  Pulmonary/Chest: Effort normal and breath sounds normal. No respiratory distress. He  has no wheezes. He has no rhonchi. He has no rales. He exhibits tenderness (left lower anterior chest wall without overlying skin abnormalities or palpable deformity/crepitus).  Respiration even and unlabored  Abdominal: Soft. He exhibits no distension. There is no tenderness. There is no rigidity, no rebound and no guarding.  Musculoskeletal:       Right lower leg: He exhibits no tenderness and no edema.       Left lower leg: He exhibits no tenderness and no edema.  Neurological: He is alert.  Clear speech.  CN III through XII grossly intact.  Normal finger-nose bilaterally.  Negative pronator drift.  Skin: Skin is warm and dry. Capillary refill takes less than 2 seconds. No rash noted.  Psychiatric: He has a normal mood and affect. His behavior is normal.  Nursing note and vitals reviewed.  ED Treatments / Results  Labs Results for orders placed or performed during the hospital encounter of 02/23/18  CBC  Result Value Ref Range   WBC 8.8 4.0 - 10.5 K/uL   RBC 5.65 4.22 - 5.81 MIL/uL   Hemoglobin 16.8 13.0 - 17.0 g/dL   HCT 47.8 39.0 - 52.0 %   MCV 84.6 80.0 - 100.0 fL   MCH 29.7 26.0 - 34.0 pg   MCHC 35.1 30.0 - 36.0 g/dL   RDW 12.1 11.5 - 15.5 %   Platelets 272 150 - 400 K/uL   nRBC 0.0 0.0 - 0.2 %  Comprehensive metabolic panel  Result Value Ref Range   Sodium 139 135 - 145 mmol/L   Potassium 5.1 3.5 - 5.1 mmol/L   Chloride 106 98 - 111 mmol/L   CO2 25 22 - 32 mmol/L   Glucose, Bld 89 70 - 99 mg/dL   BUN 11 6 - 20 mg/dL   Creatinine, Ser 0.99 0.61 - 1.24 mg/dL   Calcium 9.2 8.9 - 10.3 mg/dL   Total Protein 6.7 6.5 - 8.1 g/dL   Albumin 4.2 3.5 - 5.0 g/dL   AST 42 (H) 15 - 41 U/L   ALT 14 0 - 44 U/L   Alkaline Phosphatase 59 38 - 126 U/L   Total Bilirubin 1.9 (H) 0.3 - 1.2 mg/dL   GFR calc non Af Amer >60 >60 mL/min   GFR calc Af Amer >60 >60 mL/min   Anion gap 8 5 - 15  Lipase, blood  Result Value Ref Range   Lipase 32 11 - 51 U/L  Acetaminophen level  Result  Value Ref Range   Acetaminophen (Tylenol), Serum <10 (L) 10 - 30 ug/mL  Salicylate level  Result Value Ref Range   Salicylate Lvl <9.3 2.8 - 30.0 mg/dL  Iron (Free Iron)  Result Value Ref Range   Iron 168 45 - 182 ug/dL  I-stat troponin, ED  Result Value Ref Range   Troponin i, poc 0.00 0.00 - 0.08 ng/mL   Comment 3          I-Stat Troponin, ED (not at [facility])  Result Value Ref Range   Troponin i, poc 0.00 0.00 - 0.08 ng/mL   Comment 3  EKG EKG Interpretation  Date/Time:  Monday February 23 2018 15:22:45 EDT Ventricular Rate:  76 PR Interval:    QRS Duration: 85 QT Interval:  350 QTC Calculation: 394 R Axis:   76 Text Interpretation:  Sinus arrhythmia Borderline short PR interval Confirmed by Dene Gentry [phone]) on 02/23/2018 4:06:42 PM   Radiology Dg Chest 2 View  Result Date: 02/23/2018 CLINICAL DATA:  Left lower chest pain EXAM: CHEST - 2 VIEW COMPARISON:  02/16/2018 FINDINGS: Normal heart size and mediastinal contours. Implantable loop recorder. There is no edema, consolidation, effusion, or pneumothorax. Artifact from EKG leads. IMPRESSION: No evidence of active disease. Electronically Signed   By: Monte Fantasia M.D.   On: 02/23/2018 16:20    Procedures Procedures (including critical care time)  Medications Ordered in ED Medications - No data to display     Initial Impression / Assessment and Plan / ED Course  I have reviewed the triage vital signs and the nursing notes.  Pertinent labs & imaging results that were available during my care of the patient were reviewed by me and considered in my medical decision making (see chart for details).   Patient presents to the emergency department with palpitations, chest discomfort, generalized weakness and near syncope episode today.  Patient nontoxic-appearing, no apparent distress, vitals without significant abnormality.  Patient has a fairly benign physical exam, he does have some left-sided anterior chest  wall tenderness to palpation without overlying deformity or underlying palpable abnormality.  No abdominal tenderness.  Will further evaluate with lab work, EKG, and CXR. Will contact [employer] regarding today's sxs to coordinate with patient's loop recorder.   Initial work-up has been reviewed: No anemia.  No leukocytosis.  No significant electrolyte disturbance.  Lipase within normal limits.  Patient's AST is slightly elevated at 42 and his total bilirubin is somewhat elevated at 1.9, unclear definitive etiology, patient remains without abdominal tenderness, will require PCP recheck.  His pain is reproducible on palpation, low suspicion for pulmonary embolism. His troponin is negative and his EKG does not show obvious ischemia, do not suspect ACS.  EKG and monitor with sinus arrhythmia.  17:13: CONSULT: Discussed with Chanetta Marshall with [employer]- it appears that patient has runs of sinus tachycardia at 13:22 and 14:12 this afternoon which coordinate with his alerts. Will plan to fax report- this was reviewed.   19:10: CONSULT: Discussed case with cardiologist Dr. Acie Fredrickson who has evaluated the patient, plan for outpatient testing and safe for discharge from a cardiology standpoint.   18:55: CONSULT: Discussed with pharmacy technician who informed me that patient had told her that he has been taking two Goody powder packs 3 times daily for ongoing period of time preventatively and that he has also been taking five tablets of 325 mg iron supplements daily for past 5 days because he thought he may be anemic and that was causing his weakness/palpitations. Discussed with RN Georgia Dom who will discuss with poison control.   19:01: Ginny Forth spoke with poison control, feel that likely not toxic levels, recommends obtaining AST/ALT, acetaminophen, salicylate, and serum iron levels and call back. Labs ordered.   21:15: Labs reassuringGinny Forth again spoke with poison control, no further recommendations  based on results.  On reassessment patient is feeling somewhat better, he remains with left-sided chest pain reproducible on palpation, no abdominal tenderness/peritoneal signs, he states that typically Tylenol makes this go away when he has had it in the past and he has actually not had any Tylenol or Gabriel Earing  powers today.  Will provide a dose of Tylenol prior to discharge.  His work-up here has been overall reassuring, unclear definitive etiology to his symptoms, possibly multifactorial.  Patient appears hemodynamically stable and safe for outpatient follow-up with his cardiologist and primary care provider at this time. Discussed discontinuation of inappropriate OTC med use, instructed OTC instruction use only.  I discussed results, treatment plan, need for follow-up, and return precautions with the patient. Provided opportunity for questions, patient confirmed understanding and is in agreement with plan.   Findings and plan of care discussed with supervising physician Dr. Francia Greaves- in agreement.   Final Clinical Impressions(s) / ED Diagnoses   Final diagnoses:  Chest pain, unspecified type  Palpitations   ED Discharge Orders    None       Amaryllis Dyke, PA-C 02/23/18 2232    Valarie Merino, MD 03/03/18 [phone]

## 2018-02-23 NOTE — Discharge Instructions (Addendum)
You were seen in the emergency department today for chest pain. Your work-up in the emergency department has been overall reassuring. Your labs have been fairly normal and or similar to previous blood work you have had done.  Your AST (42) and total bilirubin (1.9) were both somewhat elevated, these are tests related to your liver, would like you to have these rechecked by your primary care provider.  Your EKG and the enzyme we use to check your heart did not show an acute heart attack at this time. Your chest x-ray was normal.   Discontinue taking the 5 tablets of iron supplements.  Please only take over-the-counter medications as prescribed.  We would like you to follow up closely with your primary care provider and/or the cardiologist provided in your discharge instructions within 1-3 days. Return to the ER immediately should you experience any new or worsening symptoms including but not limited to return of pain, worsened pain, vomiting, shortness of breath, dizziness, lightheadedness, passing out, or any other concerns that you may have.

## 2018-02-23 NOTE — ED Triage Notes (Signed)
Per ems pt had onset of LUQ CP, Dizziness, SOB. Pt has loop recorder placed to monitor arrythmias. Received 324 aspirin en route

## 2018-02-24 ENCOUNTER — Other Ambulatory Visit: Payer: Self-pay

## 2018-02-26 ENCOUNTER — Ambulatory Visit: Payer: Self-pay | Admitting: Cardiology

## 2018-02-26 ENCOUNTER — Other Ambulatory Visit: Payer: Self-pay

## 2018-02-26 DIAGNOSIS — R55 Syncope and collapse: Secondary | ICD-10-CM

## 2018-02-26 DIAGNOSIS — R002 Palpitations: Secondary | ICD-10-CM

## 2018-03-03 ENCOUNTER — Ambulatory Visit (INDEPENDENT_AMBULATORY_CARE_PROVIDER_SITE_OTHER): Payer: Self-pay | Admitting: Cardiology

## 2018-03-03 ENCOUNTER — Ambulatory Visit (HOSPITAL_COMMUNITY): Payer: Self-pay | Attending: Cardiology

## 2018-03-03 ENCOUNTER — Other Ambulatory Visit: Payer: Self-pay

## 2018-03-03 ENCOUNTER — Encounter: Payer: Self-pay | Admitting: Cardiology

## 2018-03-03 VITALS — BP 144/76 | HR 94 | Ht 66.0 in | Wt 180.0 lb

## 2018-03-03 DIAGNOSIS — R002 Palpitations: Secondary | ICD-10-CM

## 2018-03-03 LAB — ECHOCARDIOGRAM COMPLETE
Height: 66 in
Weight: 2880 oz

## 2018-03-03 NOTE — Patient Instructions (Signed)
Medication Instructions:  None ordered If you need a refill on your cardiac medications before your next appointment, please call your pharmacy.   Lab work: None ordered  Testing/Procedures: Your physician has requested that you have an echocardiogram. Echocardiography is a painless test that uses sound waves to create images of your heart. It provides your doctor with information about the size and shape of your heart and how well your heart's chambers and valves are working. This procedure takes approximately one hour. There are no restrictions for this procedure.  Follow-Up: At [facility], you and your health needs are our priority.  As part of our continuing mission to provide you with exceptional heart care, we have created designated Provider Care Teams.  These Care Teams include your primary Cardiologist (physician) and Advanced Practice Providers (APPs -  Physician Assistants and Nurse Practitioners) who all work together to provide you with the care you need, when you need it. You will need a follow up appointment in 3 months.  Please call our office 2 months in advance to schedule this appointment.  You may see Dr. Curt Bears or one of the following Advanced Practice Providers on your designated Care Team:   Chanetta Marshall, NP . Tommye Standard, PA-C  Any Other Special Instructions Will Be Listed Below (If Applicable). Continue using V8 juice  Thank you for choosing [facility]!!   Trinidad Curet, RN [phone]

## 2018-03-03 NOTE — Progress Notes (Addendum)
Electrophysiology Office Note   Date:  03/03/2018   ID:  Ian [facility], DOB 04-27-1991, MRN [id number]  PCP:  Ian Haw, MD  Cardiologist:  Lewis Primary Electrophysiologist:  Ian Meredith Leeds, MD    No chief complaint on file.    History of Present Illness: Ian Lewis is a 27 y.o. male who is being seen today for the evaluation of syncope at the request of Ian Lewis. Presenting today for electrophysiology evaluation.  He has a history of palpitations.  He recently had an episode of palpitations where he had an syncope.  He has been evaluated by a cardiologist with Holter monitoring in the past.  An echocardiogram was also done.  His monitoring was for 2 weeks.  This was found to be unremarkable.  Earlier this month he had an episode of palpitations and syncope.  He went to the emergency room which showed no arrhythmias.  He recently moved home from [place].  He was having palpitations there.  He had a 14-day monitor that showed no arrhythmias.  He also has had a stress test table test without abnormality.  He has had multiple hospitalizations since last being seen with palpitations and chest pain.  Review of Linq monitor shows no evidence of arrhythmia and sinus tachycardia.  Today, denies symptoms of palpitations, chest pain, shortness of breath, orthopnea, PND, lower extremity edema, claudication, dizziness, presyncope, syncope, bleeding, or neurologic sequela. The patient is tolerating medications without difficulties.  Since being seen in the emergency room, he is worked on staying better hydrated.  He was told to drink V8 juice in the mornings due to his high sodium content which he has been doing.  He is felt much improved since that time and has had no further episodes of palpitations.   Past Medical History:  Diagnosis Date  . Lower extremity weakness 12/17/2017   Past Surgical History:  Procedure Laterality Date  . KNEE SURGERY    . LOOP  RECORDER INSERTION N/A 02/05/2018   Procedure: LOOP RECORDER INSERTION;  Surgeon: Ian Haw, MD;  Location: [facility];  Service: Cardiovascular;  Laterality: N/A;  . MANDIBLE FRACTURE SURGERY       Current Outpatient Medications  Medication Sig Dispense Refill  . ibuprofen (ADVIL,MOTRIN) 200 MG tablet Take 800 mg by mouth every 6 (six) hours as needed for fever or headache.     No current facility-administered medications for this visit.     Allergies:   Lorabid [loracarbef]   Social History:  The patient  reports that he has never smoked. He has never used smokeless tobacco. He reports that he does not drink alcohol or use drugs.   Family History:  The patient's family history includes Atrial fibrillation in his father.    ROS:  Please see the history of present illness.   Otherwise, review of systems is positive for none.   All other systems are reviewed and negative.   PHYSICAL EXAM: VS:  BP (!) 144/76   Pulse 94   Ht 5\' 6"  (1.676 m)   Wt 180 lb (81.6 kg)   SpO2 99%   BMI 29.05 kg/m  , BMI Body mass index is 29.05 kg/m. GEN: Well nourished, well developed, in no acute distress  HEENT: normal  Neck: no JVD, carotid bruits, or masses Cardiac: RRR; no murmurs, rubs, or gallops,no edema  Respiratory:  clear to auscultation bilaterally, normal work of breathing GI: soft, nontender, nondistended, + BS MS: no deformity  or atrophy  Skin: warm and dry, device site well healed Neuro:  Strength and sensation are intact Psych: euthymic mood, full affect  EKG:  EKG is not ordered today. Personal review of the ekg ordered 02/23/18 shows sinus rhythm, rate 76, PR 117 ms  Personal review of the device interrogation today. Results in [place]: 12/17/2017: TSH 2.972 02/23/2018: ALT 14; BUN 11; Creatinine, Ser 0.99; Hemoglobin 16.8; Platelets 272; Potassium 5.1; Sodium 139    Lipid Panel  No results found for: CHOL, TRIG, HDL, CHOLHDL, VLDL, LDLCALC,  LDLDIRECT   Wt Readings from Last 3 Encounters:  03/03/18 180 lb (81.6 kg)  02/16/18 180 lb (81.6 kg)  02/05/18 170 lb (77.1 kg)      Other studies Reviewed: Additional studies/ records that were reviewed today include: Epic notes   ASSESSMENT AND PLAN:  1.  Palpitations: At this point I am unsure as to why he continues to have palpitations.  It may be associated with IST.  He has been drinking V8 juice and staying better hydrated and his symptoms have greatly improved.  There is also question of could this be due to a neuroendocrine tumor such as carcinoid or pheochromocytoma.  Since he is improved after having increased salt intake and hydration, Ian hold off on the work-up for that.  I Ian get an echocardiogram for further evaluation of LV function to make sure there is no structural heart disease.  Current medicines are reviewed at length with the patient today.   The patient does not have concerns regarding his medicines.  The following changes were made today: None  Labs/ tests ordered today include:  Orders Placed This Encounter  Procedures  . ECHOCARDIOGRAM COMPLETE    Disposition:   FU with Ian Camnitz 3 months  Signed, Ian Meredith Leeds, MD  03/03/2018 12:51 PM     [facility] [address] [place] [address] [phone] (office) [phone] (fax)

## 2018-03-04 LAB — CUP PACEART INCLINIC DEVICE CHECK
Date Time Interrogation Session: 20191023084134
MDC IDC PG IMPLANT DT: 20190926

## 2018-03-10 ENCOUNTER — Ambulatory Visit (INDEPENDENT_AMBULATORY_CARE_PROVIDER_SITE_OTHER): Payer: Self-pay | Admitting: *Deleted

## 2018-03-10 DIAGNOSIS — R002 Palpitations: Secondary | ICD-10-CM

## 2018-03-11 NOTE — Progress Notes (Signed)
[facility] Summary Report / Loop Recorder 

## 2018-03-12 ENCOUNTER — Emergency Department (HOSPITAL_COMMUNITY): Payer: Self-pay

## 2018-03-12 ENCOUNTER — Encounter (HOSPITAL_COMMUNITY): Payer: Self-pay | Admitting: Emergency Medicine

## 2018-03-12 ENCOUNTER — Other Ambulatory Visit: Payer: Self-pay

## 2018-03-12 ENCOUNTER — Emergency Department (HOSPITAL_COMMUNITY)
Admission: EM | Admit: 2018-03-12 | Discharge: 2018-03-12 | Disposition: A | Discharge status: Home / Self Care | Payer: Self-pay | Attending: Emergency Medicine | Admitting: Emergency Medicine

## 2018-03-12 DIAGNOSIS — R55 Syncope and collapse: Secondary | ICD-10-CM | POA: Insufficient documentation

## 2018-03-12 DIAGNOSIS — R202 Paresthesia of skin: Secondary | ICD-10-CM | POA: Insufficient documentation

## 2018-03-12 DIAGNOSIS — F449 Dissociative and conversion disorder, unspecified: Secondary | ICD-10-CM

## 2018-03-12 DIAGNOSIS — R200 Anesthesia of skin: Secondary | ICD-10-CM

## 2018-03-12 HISTORY — DX: Syncope and collapse: R55

## 2018-03-12 LAB — PROTIME-INR
INR: 0.98
PROTHROMBIN TIME: 12.9 s (ref 11.4–15.2)

## 2018-03-12 LAB — CBC WITH DIFFERENTIAL/PLATELET
Abs Immature Granulocytes: 0.03 10*3/uL (ref 0.00–0.07)
BASOS ABS: 0 10*3/uL (ref 0.0–0.1)
Basophils Relative: 1 %
EOS ABS: 0.1 10*3/uL (ref 0.0–0.5)
EOS PCT: 1 %
HCT: 49 % (ref 39.0–52.0)
Hemoglobin: 16.6 g/dL (ref 13.0–17.0)
Immature Granulocytes: 0 %
LYMPHS PCT: 22 %
Lymphs Abs: 1.9 10*3/uL (ref 0.7–4.0)
MCH: 28.9 pg (ref 26.0–34.0)
MCHC: 33.9 g/dL (ref 30.0–36.0)
MCV: 85.2 fL (ref 80.0–100.0)
Monocytes Absolute: 0.7 10*3/uL (ref 0.1–1.0)
Monocytes Relative: 8 %
NEUTROS PCT: 68 %
NRBC: 0 % (ref 0.0–0.2)
Neutro Abs: 5.6 10*3/uL (ref 1.7–7.7)
Platelets: 274 10*3/uL (ref 150–400)
RBC: 5.75 MIL/uL (ref 4.22–5.81)
RDW: 11.9 % (ref 11.5–15.5)
WBC: 8.3 10*3/uL (ref 4.0–10.5)

## 2018-03-12 LAB — CBG MONITORING, ED: Glucose-Capillary: 71 mg/dL (ref 70–99)

## 2018-03-12 LAB — I-STAT TROPONIN, ED: TROPONIN I, POC: 0 ng/mL (ref 0.00–0.08)

## 2018-03-12 LAB — COMPREHENSIVE METABOLIC PANEL
ALT: 19 U/L (ref 0–44)
AST: 24 U/L (ref 15–41)
Albumin: 4.4 g/dL (ref 3.5–5.0)
Alkaline Phosphatase: 64 U/L (ref 38–126)
Anion gap: 8 (ref 5–15)
BUN: 11 mg/dL (ref 6–20)
CHLORIDE: 107 mmol/L (ref 98–111)
CO2: 25 mmol/L (ref 22–32)
Calcium: 9.5 mg/dL (ref 8.9–10.3)
Creatinine, Ser: 0.95 mg/dL (ref 0.61–1.24)
GFR calc Af Amer: >60 mL/min (ref >60)
Glucose, Bld: 79 mg/dL (ref 70–99)
POTASSIUM: 3.7 mmol/L (ref 3.5–5.1)
SODIUM: 140 mmol/L (ref 135–145)
Total Bilirubin: 1.1 mg/dL (ref 0.3–1.2)
Total Protein: 7.3 g/dL (ref 6.5–8.1)

## 2018-03-12 LAB — APTT: aPTT: 32 seconds (ref 24–36)

## 2018-03-12 NOTE — Consult Note (Addendum)
Neurology Consultation  Reason for Consult: Syncope and left-sided decreased sensation Referring Physician: Tegeler  CC: Left-sided tingling and syncope  History is obtained from: Patient  HPI: Ian Lewis is a 27 y.o. male with history of lower extremity weakness, palpitations, and syncope.  Patient explains since last January he has been having multiple spells of syncope started in [place].  Patient states that he feels significant palpitations in his chest and has had a loop monitor placed for this.  He has had multiple admissions and cardiac evaluations for this which has not shown any abnormalities.  He is also seen neurology in the past which thought this was anxiety related.  He is also been in the hospital previously for left leg numbness.  At that time he had an MRI brain which was normal and spinal imaging showed minor changes with small disc protrusion at T7-8 and L5-S1 which would not explain his profound weakness.  Today patient states that he was at work when he suddenly felt his heart pounding, at that time he noted that he had left arm pain which then he noted his whole left arm was very red.  He was told to go home and rest.  As he was driving home he noted that he had palpitations again which she felt his heart racing.  He came to the emergency room at [facility].  [place]  Stopped his car stood up out of his car and then suddenly just did a face plant on the concrete.  He did not see exactly what was going on after as he was getting the gurney but did noted that he came to fairly quickly, at least within 1 to 2 minutes.  He also notes that the patient was able to talk coherently and state that his heart was racing immediately upon awakening.  During consultation patient was back to his baseline, and stated that this has happened multiple times.   ROS: A 14 point ROS was performed and is negative except as noted in the HPI.   Past  Medical History:  Diagnosis Date  . Lower extremity weakness 12/17/2017  . Syncope      Family History  Problem Relation Age of Onset  . Atrial fibrillation Father     Social History:   reports that he has never smoked. He has never used smokeless tobacco. He reports that he does not drink alcohol or use drugs.  Medications No current facility-administered medications for this encounter.   Current Outpatient Medications:  .  ibuprofen (ADVIL,MOTRIN) 200 MG tablet, Take 800 mg by mouth every 6 (six) hours as needed for fever or headache., Disp: , Rfl:    Exam: Current vital signs: BP 133/82   Pulse (!) 123   Temp 98.2 F (36.8 C) (Oral)   Resp 20   SpO2 99%  Vital signs in last 24 hours: Temp:  [98.2 F (36.8 C)] 98.2 F (36.8 C) (10/31 1523) Pulse Rate:  [89-123] 123 (10/31 1715) Resp:  [13-28] 20 (10/31 1715) BP: (114-138)/(74-92) 133/82 (10/31 1700) SpO2:  [96 %-99 %] 99 % (10/31 1715)  Physical Exam  Constitutional: Appears well-developed and well-nourished.  Psych: Affect appropriate to situation Eyes: No scleral injection HENT: No OP obstrucion Head: Normocephalic.  Cardiovascular: Normal rate and regular rhythm.  Respiratory: Effort normal, non-labored breathing GI: Soft.  No distension. There is no tenderness.  Skin: WDI  Neuro: Mental Status: Patient is awake, alert, oriented to  person, place, month, year, and situation. Patient is able to give a clear and coherent history. No signs of aphasia or neglect Cranial Nerves: II: Visual Fields are full. .   III,IV, VI: EOMI without ptosis or diploplia. Pupils are equal, round, and reactive to light V: Facial sensation is stated to be decrease along the whole face in the left.  Of note he did split midline with tuning fork VII: Facial movement is symmetric.  VIII: hearing is intact to voice X: Uvula elevates symmetrically XI: Shoulder shrug is symmetric. XII: tongue is midline without atrophy or  fasciculations.  Motor: Tone is normal. Bulk is normal. 5/5 strength was present in all four extremities.  She did have a downward drift without pronation of his left arm.  In addition when asked to orbit with his hands there was no orbiting around 1 specific arm it was equal  sensory: Decreased sensation along the left side Deep Tendon Reflexes: 2+ and symmetric in the biceps and patellae.  Plantars: Toes are downgoing bilaterally.  Cerebellar: FNF and HKS are intact bilaterally   Labs I have reviewed labs in epic and the results pertinent to this consultation are:   CBC    Component Value Date/Time   WBC 8.3 03/12/2018 1615   RBC 5.75 03/12/2018 1615   HGB 16.6 03/12/2018 1615   HCT 49.0 03/12/2018 1615   PLT 274 03/12/2018 1615   MCV 85.2 03/12/2018 1615   MCH 28.9 03/12/2018 1615   MCHC 33.9 03/12/2018 1615   RDW 11.9 03/12/2018 1615   LYMPHSABS 1.9 03/12/2018 1615   MONOABS 0.7 03/12/2018 1615   EOSABS 0.1 03/12/2018 1615   BASOSABS 0.0 03/12/2018 1615    CMP     Component Value Date/Time   NA 139 02/23/2018 1536   K 5.1 02/23/2018 1536   CL 106 02/23/2018 1536   CO2 25 02/23/2018 1536   GLUCOSE 89 02/23/2018 1536   BUN 11 02/23/2018 1536   CREATININE 0.99 02/23/2018 1536   CALCIUM 9.2 02/23/2018 1536   PROT 6.7 02/23/2018 1536   ALBUMIN 4.2 02/23/2018 1536   AST 42 (H) 02/23/2018 1536   ALT 14 02/23/2018 1536   ALKPHOS 59 02/23/2018 1536   BILITOT 1.9 (H) 02/23/2018 1536   GFRNONAA >60 02/23/2018 1536   GFRAA >60 02/23/2018 1536    Lipid Panel  No results found for: CHOL, TRIG, HDL, CHOLHDL, VLDL, LDLCALC, LDLDIRECT   Imaging  MRI-ordered  Etta Quill PA-C [facility] [phone]  M-F  (9:00 am- 5:00 PM)  03/12/2018, 5:31 PM   I have seen the patient and reviewed the above note.   Assessment:  27 year old male with complaints of chest palpitations, left-sided decreased sensation and syncope.  Patient has had a thorough  work-up in the past and currently has a loop recorder.  His exam has multiple findings consistent with nonphysiological weakness, given the fall as well as a focal findings, I think that an MRI would not be unreasonable, but if this is negative then I would not pursue any further work-up.  Recommendations: -MRI brain if negative no further work-up  Roland Rack, MD [facility] [phone]  If 7pm- 7am, please page neurology on call as listed in [place].

## 2018-03-12 NOTE — Discharge Instructions (Addendum)
Work up and imaging normal.   Follow up with neurology and psych.   Return for chest pain or shortness of breath with exertion, slurred speech  Do not drive until you are evaluated by neurology

## 2018-03-12 NOTE — ED Provider Notes (Signed)
[facility] EMERGENCY DEPARTMENT Provider Note   CSN: [id number] Arrival date & time: 03/12/18  1510     History   Chief Complaint Chief Complaint  Patient presents with  . Loss of Consciousness  . Seizures    HPI ALLAH REASON is a 27 y.o. male with h/o palpitations, syncope, is here for evaluation of syncope that occurred immediately PTA.  Pt states he was taking a test while at work sitting down when he suddenly developed palpitations.  Additionally, developed light-headedness, left arm pain and redness like circulation was getting cut off and numbness (no sensation), not tingling. 911 was called and EMS checked him and told him he looked ok. As he was pulling into ER symptoms worsened, he got out of his car and felt light-headed. Witnesses said he fell forward and had a "seizure" for a few seconds. Associated symptoms include transient headache and brief blurred vision. He did not have associated CP, SOB, nausea, vomiting, diaphoresis, abdominal or back pain. He drinks a lot of water. 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 After syncope he reports continued mild headache, left knee/shin pain after fall, decreased sensation and weakness to entire body including face, arm, leg.  Otherwise feels at baseline without CP, SOB, palpitations, nausea, vomiting.    States he has had previous palpitation and syncope with seizures in the past.  Reports neurology f/u, has had normal EEG w/o evidence of seizures . Has intermittent numbness and weakness of left leg for up to a week, admitted for this and had MRI of brain and complete spine, seen by neurology.  Follows up with cardiology [facility]), has had normal holter monitoring and echos.  Was told he has SVT. Cardiology wants to keep monitor for longer. Cardiology is considering endocrine issues  Denies tobacco use, alcohol use, illicit drug use.   HPI  Past Medical History:  Diagnosis Date  . Lower extremity  weakness 12/17/2017  . Syncope     Patient Active Problem List   Diagnosis Date Noted  . Palpitations 01/23/2018  . Syncope 01/23/2018  . Lower extremity weakness 12/17/2017    Past Surgical History:  Procedure Laterality Date  . KNEE SURGERY    . LOOP RECORDER INSERTION N/A 02/05/2018   Procedure: LOOP RECORDER INSERTION;  Surgeon: Constance Haw, MD;  Location: [facility];  Service: Cardiovascular;  Laterality: N/A;  . MANDIBLE FRACTURE SURGERY          Home Medications    Prior to Admission medications   Medication Sig Start Date End Date Taking? Authorizing Provider  ibuprofen (ADVIL,MOTRIN) 200 MG tablet Take 800 mg by mouth every 6 (six) hours as needed for fever or headache.    [provider]    Family History Family History  Problem Relation Age of Onset  . Atrial fibrillation Father     Social History Social History   Tobacco Use  . Smoking status: Never Smoker  . Smokeless tobacco: Never Used  Substance Use Topics  . Alcohol use: No  . Drug use: No     Allergies   Lorabid [loracarbef]   Review of Systems Review of Systems  Cardiovascular: Positive for palpitations.  Skin: Positive for color change (left hand).  Neurological: Positive for syncope, light-headedness, numbness and headaches.  All other systems reviewed and are negative.    Physical Exam Updated Vital Signs BP 115/80   Pulse 78   Temp 98.2 F (36.8 C) (Oral)  Resp 16   SpO2 97%   Physical Exam  Constitutional: He is oriented to person, place, and time. He appears well-developed and well-nourished. No distress.  NAD. Awake in cervical collar   HENT:  Head: Normocephalic and atraumatic.  Right Ear: External ear normal.  Left Ear: External ear normal.  Nose: Nose normal.  MMM. No tongue injury or intraoral bleeding  Eyes: Conjunctivae and EOM are normal. No scleral icterus.  Neck: Normal range of motion. Neck supple.  No midline c-spine  tenderness. No paraspinal muscle tenderness. Full ROM of neck without pain.   Cardiovascular: Normal rate, regular rhythm and normal heart sounds.  1+ radial and DP pulses bilaterally. No LE edema or calf tenderness.   Pulmonary/Chest: Effort normal and breath sounds normal.  No anterior or posterior chest wall tenderness   Musculoskeletal: Normal range of motion. He exhibits tenderness. He exhibits no deformity.  Diffuse tenderness to anterior left knee and proximal tibia, no ecchymosis. Full ROM of knee.  Neurological: He is alert and oriented to person, place, and time. A sensory deficit is present.  Alert and oriented to self, place, time and event.  Speech is fluent without obvious dysarthria or aphasia. Slight 4/5 strength with SOME movements in left arm and left leg. Slight decreased strength with hand grip, foot dorsiflexion/plantar flexion. Cannot hold left leg off bed. No left arm drift. Subjective decreased sensation to left face, upper and lower extremities.  No truncal sway. No pronator drift.  Normal finger-to-nose and finger tapping.  CN II-XII grossly intact bilaterally.  Knee DTR symmetric bilaterally. No ankle clonus.   Skin: Skin is warm and dry. Capillary refill takes less than 2 seconds.  Psychiatric: He has a normal mood and affect. His behavior is normal. Judgment and thought content normal.  Nursing note and vitals reviewed.    ED Treatments / Results  Labs (all labs ordered are listed, but only abnormal results are displayed) Labs Reviewed  CBC WITH DIFFERENTIAL/PLATELET  COMPREHENSIVE METABOLIC PANEL  PROTIME-INR  APTT  URINALYSIS, ROUTINE W REFLEX MICROSCOPIC  RAPID URINE DRUG SCREEN, HOSP PERFORMED  CBG MONITORING, ED  I-STAT TROPONIN, ED    EKG EKG Interpretation  Date/Time:  Thursday March 12 2018 16:04:24 EDT Ventricular Rate:  83 PR Interval:    QRS Duration: 89 QT Interval:  338 QTC Calculation: 398 R Axis:   68 Text Interpretation:   Sinus arrhythmia Borderline short PR interval when compred to priorm similar to prior.  No STEMI Confirmed by Antony Blackbird [phone]) on 03/12/2018 4:34:42 PM   Radiology Dg Chest 2 View  Result Date: 03/12/2018 CLINICAL DATA:  Heart palpitations. EXAM: CHEST - 2 VIEW COMPARISON:  Chest x-ray 02/23/2018 FINDINGS: The cardiac silhouette, mediastinal and hilar contours are normal in stable. A loop recorder is noted. The lungs are clear. No pleural effusion. The bony thorax is intact. IMPRESSION: No acute cardiopulmonary findings. Electronically Signed   By: Marijo Sanes M.D.   On: 03/12/2018 20:11   Mr Brain Wo Contrast  Result Date: 03/12/2018 CLINICAL DATA:  Left-sided weakness EXAM: MRI HEAD WITHOUT CONTRAST TECHNIQUE: Multiplanar, multiecho pulse sequences of the brain and surrounding structures were obtained without intravenous contrast. COMPARISON:  None. FINDINGS: BRAIN: There is no acute infarct, acute hemorrhage, hydrocephalus or extra-axial collection. The midline structures are normal. No midline shift or other mass effect. There are no old infarcts. The white matter signal is normal for the patient's age. The cerebral and cerebellar volume are age-appropriate. Susceptibility-sensitive sequences show no  chronic microhemorrhage or superficial siderosis. VASCULAR: Major intracranial arterial and venous sinus flow voids are normal. SKULL AND UPPER CERVICAL SPINE: Calvarial bone marrow signal is normal. There is no skull base mass. Visualized upper cervical spine and soft tissues are normal. SINUSES/ORBITS: No fluid levels or advanced mucosal thickening. No mastoid or middle ear effusion. The orbits are normal. IMPRESSION: Normal MRI of the brain. Electronically Signed   By: Ulyses Jarred M.D.   On: 03/12/2018 19:02   Dg Knee Complete 4 Views Left  Result Date: 03/12/2018 CLINICAL DATA:  Seizure.  Fell. EXAM: LEFT KNEE - COMPLETE 4+ VIEW COMPARISON:  04/14/2011 FINDINGS: The joint spaces are  maintained. Patellofemoral joint degenerative changes, advanced for age. No acute fracture is identified. No joint effusion. IMPRESSION: No acute fracture or joint effusion. Degenerative changes, advanced for age, possibly posttraumatic. Electronically Signed   By: Marijo Sanes M.D.   On: 03/12/2018 20:13    Procedures Procedures (including critical care time)     Medications Ordered in ED Medications - No data to display   Initial Impression / Assessment and Plan / ED Course  I have reviewed the triage vital signs and the nursing notes.  Pertinent labs & imaging results that were available during my care of the patient were reviewed by me and considered in my medical decision making (see chart for details).     Initial symptoms onset around 1400.  Syncope and left sided weakness and decreased sensation began at triage in ER at 1523.  1640: Spoke to Dr Leonel Ramsay, he will see in ER. Ddx Todd's paralysis vs stroke vs syncope with seizure vs psych.  1650: Neurology at bedside.  2000: MRI normal. Labs completely normal. Discussed results with pt.  He refused CXR and knee x-ray initially but now agreeable. I reviewed neurology note.  They are favoring conversion disorder. Pending x-rays and UDS. Pending [employer] call back.  2315: x-rays negative. Spoke to [employer] rep, no abnormal rhythms captured within time frame of interest 1400-1700. VSS. Discussed work up with pt, discussed conversion disorder. We will dc with neuropsych f/u.  He was advised to not drive until evaluated/cleared by neuro.  Return precautions given. Pt in agreement.   Final Clinical Impressions(s) / ED Diagnoses   Final diagnoses:  Syncope and collapse  Left sided numbness    ED Discharge Orders    None       Kinnie Feil, PA-C 03/12/18 2332    Tegeler, Gwenyth Allegra, MD 03/13/18 [phone]

## 2018-03-12 NOTE — ED Triage Notes (Signed)
Pt drives himself to ER after being evaluated at work by ems for heart palpitations, he was on hisway home and felt his get dizzy so he drove to ER and fell out of the car ? HITTING HIS HEAD AND HAVING A SZS, PT STATES THIS HAPPENED bEFORE , Pt was laying on  Street where he fell out of car eye lids fluttering, non responsive and then he woke with a start and was able to give hx and assisted himself on to the str. He has  sz when his hear rate rate is elevated has a loop recorder he states , pt was abne to give clearly his name and sociol number. Pt warned and states he clearly understands that he is NOT to drive until this cleared up. Pt  AAO x4  At this time attempting to call his mom and his phone is dead

## 2018-03-12 NOTE — ED Notes (Signed)
Patient transported to X-ray 

## 2018-03-12 NOTE — ED Notes (Signed)
Loop recorder interrogated-waiting on response

## 2018-03-12 NOTE — ED Notes (Signed)
Patient verbalizes understanding of discharge instructions. Opportunity for questioning and answers were provided. Armband removed by staff, pt discharged from ED. Pt wheeled to lobby. 

## 2018-03-26 ENCOUNTER — Ambulatory Visit: Payer: Self-pay | Admitting: Neurology

## 2018-03-26 ENCOUNTER — Encounter: Payer: Self-pay | Admitting: Neurology

## 2018-03-26 ENCOUNTER — Other Ambulatory Visit: Payer: Self-pay | Admitting: Internal Medicine

## 2018-03-26 VITALS — BP 117/77 | HR 64 | Resp 16 | Ht 66.0 in | Wt 188.0 lb

## 2018-03-26 DIAGNOSIS — R55 Syncope and collapse: Secondary | ICD-10-CM

## 2018-03-26 NOTE — Progress Notes (Signed)
Reason for visit: Syncope, left-sided numbness  Referring physician: [place]  Ian Lewis is a 27 y.o. male  History of present illness:  Ian Lewis is a 28 year old left-handed white male with a history of episodes of syncope and left-sided numbness and weakness.  The patient has been to the emergency room on multiple occasions, he was admitted to the hospital on 16 December 2008 for syncope and associated left leg and left-sided numbness and weakness.  He underwent MRI evaluation of the brain, cervical spine, thoracic spine, and lumbar spine.  The studies were completely normal.  The patient once again came back to the emergency room on 31 October, he drove himself to the ER with palpitations of the heart, when he got out of the car he apparently blacked out and may have had some twitching.  The patient developed left-sided numbness and weakness that has persisted to the present date.  The patient again underwent MRI evaluation of the brain that was again normal.  He has had a loop recorder placed, a 2D echocardiogram has been done and was unremarkable.  He has been seen by cardiology.  The patient does not have a primary care physician here.  He is out of work, he works in the Mining engineer business.  The patient is sent here for an evaluation.  The patient denies headaches, visual changes, or any changes in speech or swallowing.  He denies issues controlling the bowels or the bladder.  Past Medical History:  Diagnosis Date  . Lower extremity weakness 12/17/2017  . Syncope   . Vision abnormalities     Past Surgical History:  Procedure Laterality Date  . KNEE SURGERY    . LOOP RECORDER INSERTION N/A 02/05/2018   Procedure: LOOP RECORDER INSERTION;  Surgeon: Constance Haw, MD;  Location: [facility];  Service: Cardiovascular;  Laterality: N/A;  . MANDIBLE FRACTURE SURGERY      Family History  Problem Relation Age of Onset  . Atrial fibrillation Father     Social  history:  reports that he has never smoked. He has never used smokeless tobacco. He reports that he does not drink alcohol or use drugs.  Medications:  Prior to Admission medications   Medication Sig Start Date End Date Taking? Authorizing Provider  ibuprofen (ADVIL,MOTRIN) 200 MG tablet Take 800 mg by mouth every 6 (six) hours as needed for fever or headache.    [provider]      Allergies  Allergen Reactions  . Lorabid [Loracarbef] Hives    ROS:  Out of a complete 14 system review of symptoms, the patient complains only of the following symptoms, and all other reviewed systems are negative.  Numbness, weakness, seizure, passing out Palpitations of the heart  Blood pressure 117/77, pulse 64, resp. rate 16, height 5\' 6"  (1.676 m), weight 188 lb (85.3 kg).  Physical Exam  General: The patient is alert and cooperative at the time of the examination.  Eyes: Pupils are equal, round, and reactive to light. Discs are flat bilaterally.  Neck: The neck is supple, no carotid bruits are noted.  Respiratory: The respiratory examination is clear.  Cardiovascular: The cardiovascular examination reveals a regular rate and rhythm, no obvious murmurs or rubs are noted.  Skin: Extremities are without significant edema.  Neurologic Exam  Mental status: The patient is alert and oriented x 3 at the time of the examination. The patient has apparent normal recent and remote memory, with an apparently  normal attention span and concentration ability.  Cranial nerves: Facial symmetry is present. There is good sensation of the face to pinprick and soft touch on the right, decreased on the left.  The patient splits midline with vibration sensation on forehead, decreased on the left. The strength of the facial muscles and the muscles to head turning and shoulder shrug are normal bilaterally. Speech is well enunciated, no aphasia or dysarthria is noted. Extraocular movements are full. Visual  fields are full. The tongue is midline, and the patient has symmetric elevation of the soft palate. No obvious hearing deficits are noted.  Motor: The motor testing reveals 5 over 5 strength of all 4 extremities. Good symmetric motor tone is noted throughout.  Sensory: Sensory testing is notable for decreased pinprick sensation on the left arm and leg as compared to the right, decreased vibration sensation on the left arm and leg.  Position sense is intact in all fours. No evidence of extinction is noted.  Coordination: Cerebellar testing reveals good finger-nose-finger and heel-to-shin bilaterally.  Gait and station: Gait is normal. Tandem gait is normal. Romberg is negative. No drift is seen.  Reflexes: Deep tendon reflexes are symmetric and normal bilaterally. Toes are downgoing bilaterally.   MRI brain, cervical, thoracic, and lumbar 12/16/17:  IMPRESSION: MRI head:  1. Normal MRI of the head with and without contrast.  MRI cervical spine:  1. Normal MRI of the cervical spine with and without contrast.  MRI thoracic spine:  1. Small T7-8 disc protrusion contacting and deforming the ventral spinal cord. 2. Otherwise negative MRI of the thoracic spine with and without contrast.  MRI lumbar spine:  1. L5-S1 disc protrusion and annular fissure. 2. Otherwise negative MRI of the lumbar spine with and without Contrast.   MRI brain 03/12/18:  IMPRESSION: Normal MRI of the brain.   Assessment/Plan:  1.  Episodic syncope, palpitations of the heart  2.  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  The patient is being followed through cardiology.  We will set him up for an EEG study.  If this is normal, he may return to work in full capacity from a neurologic standpoint.  Jill Alexanders MD 03/26/2018 12:01 PM  [facility] [address] [place], [place] [address]  Phone [phone] Fax [phone]

## 2018-04-01 LAB — CUP PACEART REMOTE DEVICE CHECK
Date Time Interrogation Session: 20191029173653
MDC IDC PG IMPLANT DT: 20190926

## 2018-04-13 ENCOUNTER — Ambulatory Visit (INDEPENDENT_AMBULATORY_CARE_PROVIDER_SITE_OTHER): Payer: Self-pay

## 2018-04-13 DIAGNOSIS — R55 Syncope and collapse: Secondary | ICD-10-CM

## 2018-04-13 NOTE — Progress Notes (Signed)
[facility] Summary Report / Loop Recorder 

## 2018-04-22 ENCOUNTER — Telehealth: Payer: Self-pay | Admitting: Neurology

## 2018-04-22 ENCOUNTER — Ambulatory Visit (INDEPENDENT_AMBULATORY_CARE_PROVIDER_SITE_OTHER): Payer: Self-pay | Admitting: Neurology

## 2018-04-22 ENCOUNTER — Encounter: Payer: Self-pay | Admitting: Neurology

## 2018-04-22 ENCOUNTER — Encounter

## 2018-04-22 DIAGNOSIS — R55 Syncope and collapse: Secondary | ICD-10-CM

## 2018-04-22 NOTE — Procedures (Signed)
    History:  Ian Lewis is a 27 year old gentleman with a history of episodes of syncope and left-sided numbness and weakness.  Prior MRI evaluations have been unremarkable, he is being evaluated for possible seizures.  This is a routine EEG.  No skull defects are noted.  Medications include ibuprofen.  EEG classification: Normal awake  Description of the recording: The background rhythms of this recording consists of a fairly well modulated medium amplitude alpha rhythm of 9 Hz that is reactive to eye opening and closure. As the record progresses, the patient appears to remain in the waking state throughout the recording. Photic stimulation was performed, resulting in a bilateral and symmetric photic driving response. Hyperventilation was also performed, resulting in a minimal buildup of the background rhythm activities without significant slowing seen. At no time during the recording does there appear to be evidence of spike or spike wave discharges or evidence of focal slowing. EKG monitor shows no evidence of cardiac rhythm abnormalities with a heart rate of 54.  Impression: This is a normal EEG recording in the waking state. No evidence of ictal or interictal discharges are seen.

## 2018-04-22 NOTE — Telephone Encounter (Signed)
I called the patient.  EEG study was unremarkable, from a neurologic standpoint he is fully cleared to return to work in full capacity.  I will dictate a letter regarding this.  A letter has been dictated, the patient will call tomorrow and give [place] a fax number to send the letter to.

## 2018-04-23 NOTE — Telephone Encounter (Signed)
I contacted the patient to verify if the fax number provided was a direct fax for the patient only. He stated the fax was for his employer. I advised in order to fax to the number provided a medical records form would need to be completed. Patient was advised if he did not want to complete the form he could come by the office and pick the letter up and take directly to his employer. Patient stated he would come by the office and pick the letter up to take to his employer. Letter placed in patient form pick up at front desk. MB RN.

## 2018-04-23 NOTE — Telephone Encounter (Signed)
Pt call stating a good fax # would be  [phone] atten Mr. Henrene Pastor

## 2018-05-08 LAB — CUP PACEART REMOTE DEVICE CHECK
Date Time Interrogation Session: 20191201184010
Implantable Pulse Generator Implant Date: 20190926

## 2018-05-15 ENCOUNTER — Ambulatory Visit (INDEPENDENT_AMBULATORY_CARE_PROVIDER_SITE_OTHER): Payer: Self-pay

## 2018-05-15 DIAGNOSIS — R55 Syncope and collapse: Secondary | ICD-10-CM

## 2018-05-15 LAB — CUP PACEART REMOTE DEVICE CHECK
Date Time Interrogation Session: 20200103190827
MDC IDC PG IMPLANT DT: 20190926

## 2018-05-18 NOTE — Progress Notes (Signed)
[facility] Summary Report / Loop Recorder 

## 2018-05-22 ENCOUNTER — Encounter: Payer: Self-pay | Admitting: Cardiology

## 2018-06-12 ENCOUNTER — Encounter: Payer: Self-pay | Admitting: Cardiology

## 2018-06-12 DIAGNOSIS — R200 Anesthesia of skin: Secondary | ICD-10-CM

## 2018-06-12 DIAGNOSIS — R29898 Other symptoms and signs involving the musculoskeletal system: Secondary | ICD-10-CM | POA: Insufficient documentation

## 2018-06-12 HISTORY — DX: Anesthesia of skin: R20.0

## 2018-06-12 HISTORY — DX: Other symptoms and signs involving the musculoskeletal system: R29.898

## 2018-06-17 ENCOUNTER — Ambulatory Visit (INDEPENDENT_AMBULATORY_CARE_PROVIDER_SITE_OTHER): Payer: Self-pay

## 2018-06-17 DIAGNOSIS — R55 Syncope and collapse: Secondary | ICD-10-CM

## 2018-06-17 LAB — CUP PACEART REMOTE DEVICE CHECK
Date Time Interrogation Session: 20200205204035
Implantable Pulse Generator Implant Date: 20190926

## 2018-06-23 NOTE — Telephone Encounter (Signed)
Error

## 2018-06-26 NOTE — Progress Notes (Signed)
[facility] Summary Report / Loop Recorder 

## 2018-07-20 ENCOUNTER — Ambulatory Visit (INDEPENDENT_AMBULATORY_CARE_PROVIDER_SITE_OTHER): Payer: Self-pay | Admitting: *Deleted

## 2018-07-20 DIAGNOSIS — R55 Syncope and collapse: Secondary | ICD-10-CM

## 2018-07-21 LAB — CUP PACEART REMOTE DEVICE CHECK
Date Time Interrogation Session: 20200309203841
MDC IDC PG IMPLANT DT: 20190926

## 2018-07-27 NOTE — Progress Notes (Signed)
[facility] Summary Report / Loop Recorder 

## 2018-08-24 ENCOUNTER — Other Ambulatory Visit: Payer: Self-pay

## 2018-08-24 ENCOUNTER — Ambulatory Visit (INDEPENDENT_AMBULATORY_CARE_PROVIDER_SITE_OTHER): Payer: Self-pay | Admitting: *Deleted

## 2018-08-24 DIAGNOSIS — R55 Syncope and collapse: Secondary | ICD-10-CM

## 2018-08-24 DIAGNOSIS — R002 Palpitations: Secondary | ICD-10-CM

## 2018-08-24 LAB — CUP PACEART REMOTE DEVICE CHECK
Date Time Interrogation Session: 20200411214155
Implantable Pulse Generator Implant Date: 20190926

## 2018-08-31 NOTE — Progress Notes (Signed)
[facility] Summary Report / Loop Recorder 

## 2018-09-04 ENCOUNTER — Telehealth: Payer: Self-pay

## 2018-09-04 NOTE — Telephone Encounter (Signed)
Spoke with patient regarding disconnected monitor 

## 2018-09-24 ENCOUNTER — Other Ambulatory Visit: Payer: Self-pay

## 2018-09-24 ENCOUNTER — Ambulatory Visit (INDEPENDENT_AMBULATORY_CARE_PROVIDER_SITE_OTHER): Payer: Self-pay | Admitting: *Deleted

## 2018-09-24 DIAGNOSIS — R55 Syncope and collapse: Secondary | ICD-10-CM

## 2018-09-25 LAB — CUP PACEART REMOTE DEVICE CHECK
Date Time Interrogation Session: 20200514224159
Implantable Pulse Generator Implant Date: 20190926

## 2018-09-29 NOTE — Progress Notes (Signed)
[facility] Summary Report / Loop Recorder 

## 2018-10-27 ENCOUNTER — Ambulatory Visit (INDEPENDENT_AMBULATORY_CARE_PROVIDER_SITE_OTHER): Payer: Self-pay | Admitting: *Deleted

## 2018-10-27 DIAGNOSIS — R55 Syncope and collapse: Secondary | ICD-10-CM

## 2018-10-28 LAB — CUP PACEART REMOTE DEVICE CHECK
Date Time Interrogation Session: 20200616234123
Implantable Pulse Generator Implant Date: 20190926

## 2018-11-05 ENCOUNTER — Encounter: Payer: Self-pay | Admitting: Cardiology

## 2018-11-05 NOTE — Progress Notes (Signed)
[facility] Summary Report / Loop Recorder 

## 2018-11-30 ENCOUNTER — Ambulatory Visit (INDEPENDENT_AMBULATORY_CARE_PROVIDER_SITE_OTHER): Payer: Self-pay | Admitting: *Deleted

## 2018-11-30 DIAGNOSIS — R55 Syncope and collapse: Secondary | ICD-10-CM

## 2018-11-30 LAB — CUP PACEART REMOTE DEVICE CHECK
Date Time Interrogation Session: 20200720000736
Implantable Pulse Generator Implant Date: 20190926

## 2018-12-14 NOTE — Progress Notes (Signed)
[facility] Summary Report / Loop Recorder 

## 2019-01-01 ENCOUNTER — Ambulatory Visit (INDEPENDENT_AMBULATORY_CARE_PROVIDER_SITE_OTHER): Payer: Self-pay | Admitting: *Deleted

## 2019-01-01 DIAGNOSIS — R55 Syncope and collapse: Secondary | ICD-10-CM

## 2019-01-03 LAB — CUP PACEART REMOTE DEVICE CHECK
Date Time Interrogation Session: 20200822004008
Implantable Pulse Generator Implant Date: 20190926

## 2019-01-08 NOTE — Progress Notes (Signed)
[facility] Summary Report / Loop Recorder 

## 2019-02-03 ENCOUNTER — Ambulatory Visit (INDEPENDENT_AMBULATORY_CARE_PROVIDER_SITE_OTHER): Payer: Self-pay | Admitting: *Deleted

## 2019-02-03 DIAGNOSIS — R55 Syncope and collapse: Secondary | ICD-10-CM

## 2019-02-04 LAB — CUP PACEART REMOTE DEVICE CHECK
Date Time Interrogation Session: 20200924003942
Implantable Pulse Generator Implant Date: 20190926

## 2019-02-05 ENCOUNTER — Telehealth: Payer: Self-pay

## 2019-02-05 NOTE — Telephone Encounter (Signed)
Unable to leave a message for patient regarding disconnected monitor 

## 2019-02-09 NOTE — Progress Notes (Signed)
[facility] Summary Report / Loop Recorder 

## 2019-02-11 DIAGNOSIS — I471 Supraventricular tachycardia, unspecified: Secondary | ICD-10-CM

## 2019-02-11 DIAGNOSIS — Z95818 Presence of other cardiac implants and grafts: Secondary | ICD-10-CM | POA: Insufficient documentation

## 2019-02-11 HISTORY — DX: Supraventricular tachycardia: I47.1

## 2019-02-11 HISTORY — DX: Presence of other cardiac implants and grafts: Z95.818

## 2019-02-11 HISTORY — DX: Supraventricular tachycardia, unspecified: I47.10

## 2019-02-12 ENCOUNTER — Telehealth: Payer: Self-pay

## 2019-02-12 NOTE — Telephone Encounter (Signed)
I spoke with the pt and he states he wants to be transferred to [facility] because of his insurance changing. I let the pt know I did release him. He thanked me for the call.

## 2019-02-19 DIAGNOSIS — M2211 Recurrent subluxation of patella, right knee: Secondary | ICD-10-CM

## 2019-02-19 HISTORY — DX: Recurrent subluxation of patella, right knee: M22.11

## 2019-03-08 ENCOUNTER — Encounter: Payer: Self-pay | Admitting: *Deleted

## 2019-03-23 IMAGING — CR DG KNEE COMPLETE 4+V*L*
4 series · 4 of 4 positions shown · non-contrast
Comparison: 04/14/2011

CLINICAL DATA: Seizure.  Fell.

EXAM:
LEFT KNEE - COMPLETE 4+ VIEW

[knee ap]
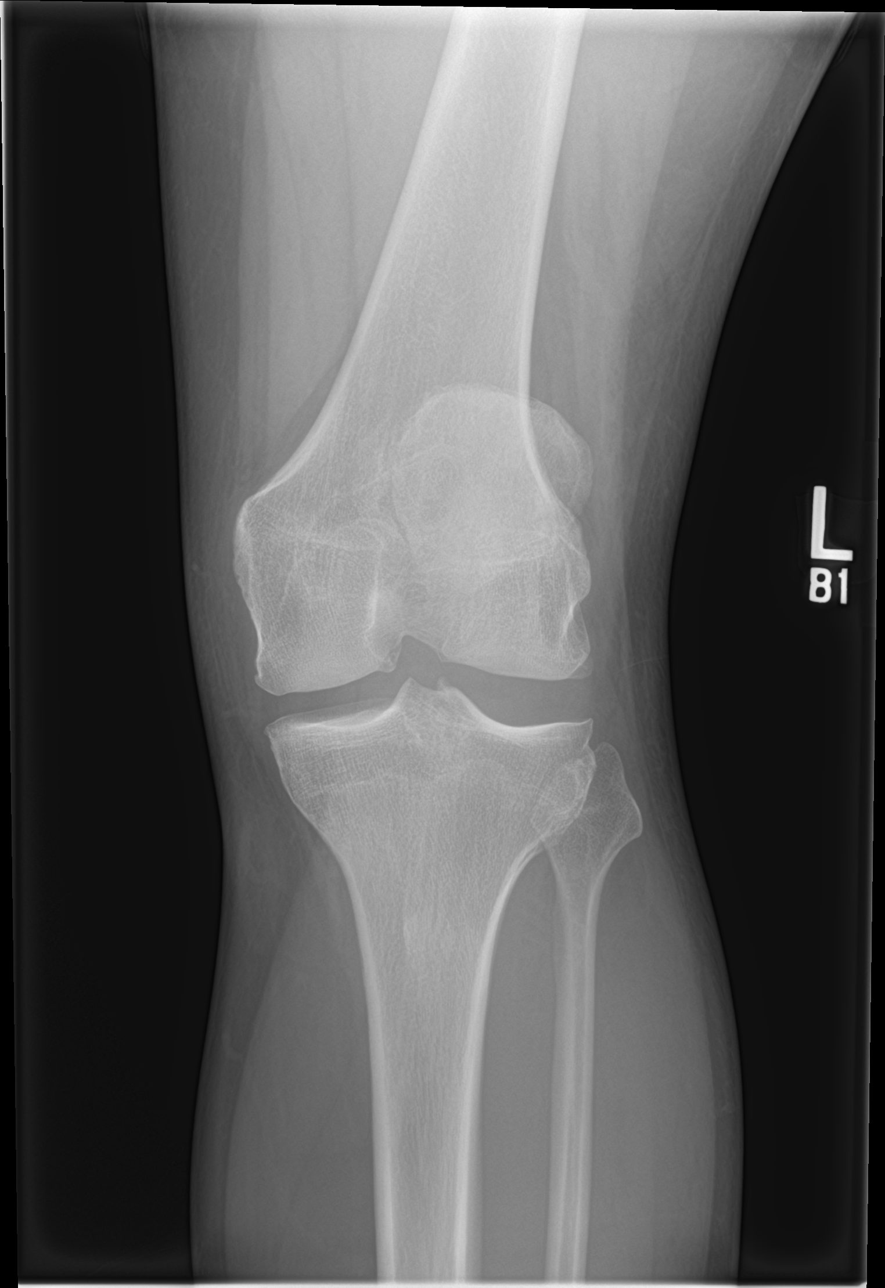

[knee lat]
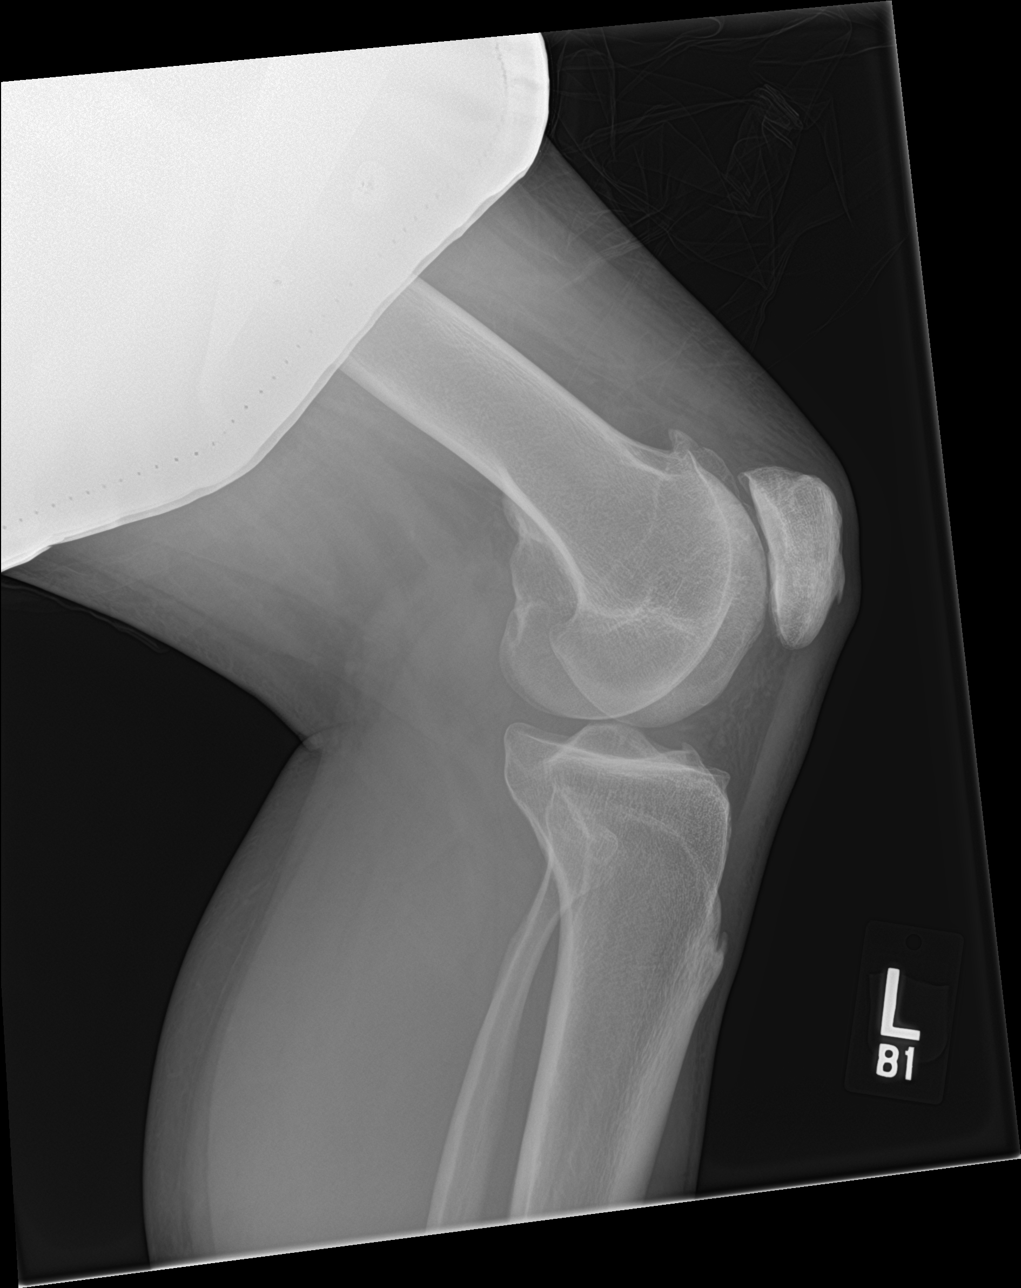

[knee obl (1 of 2)]
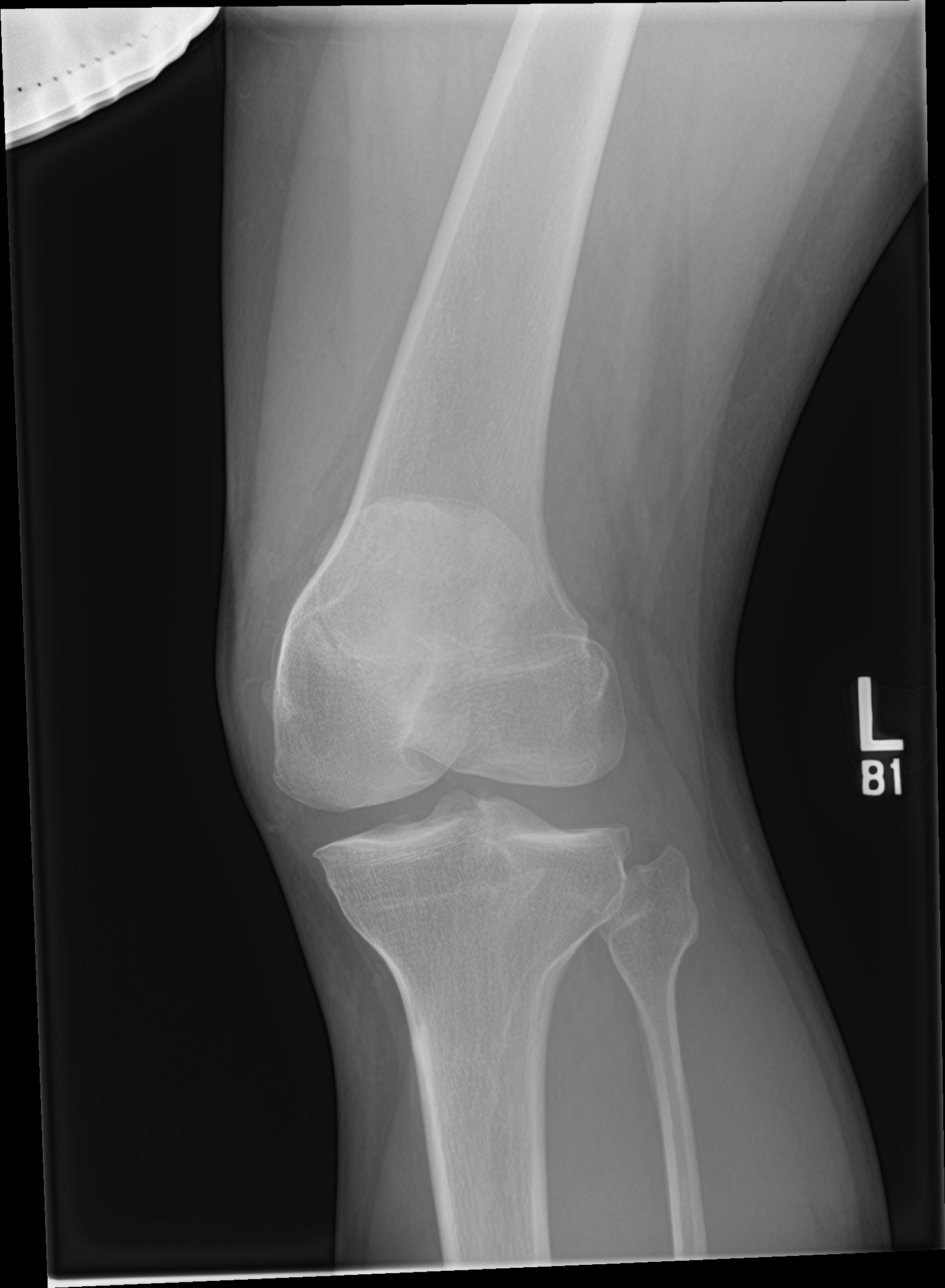

[knee obl (2 of 2)]
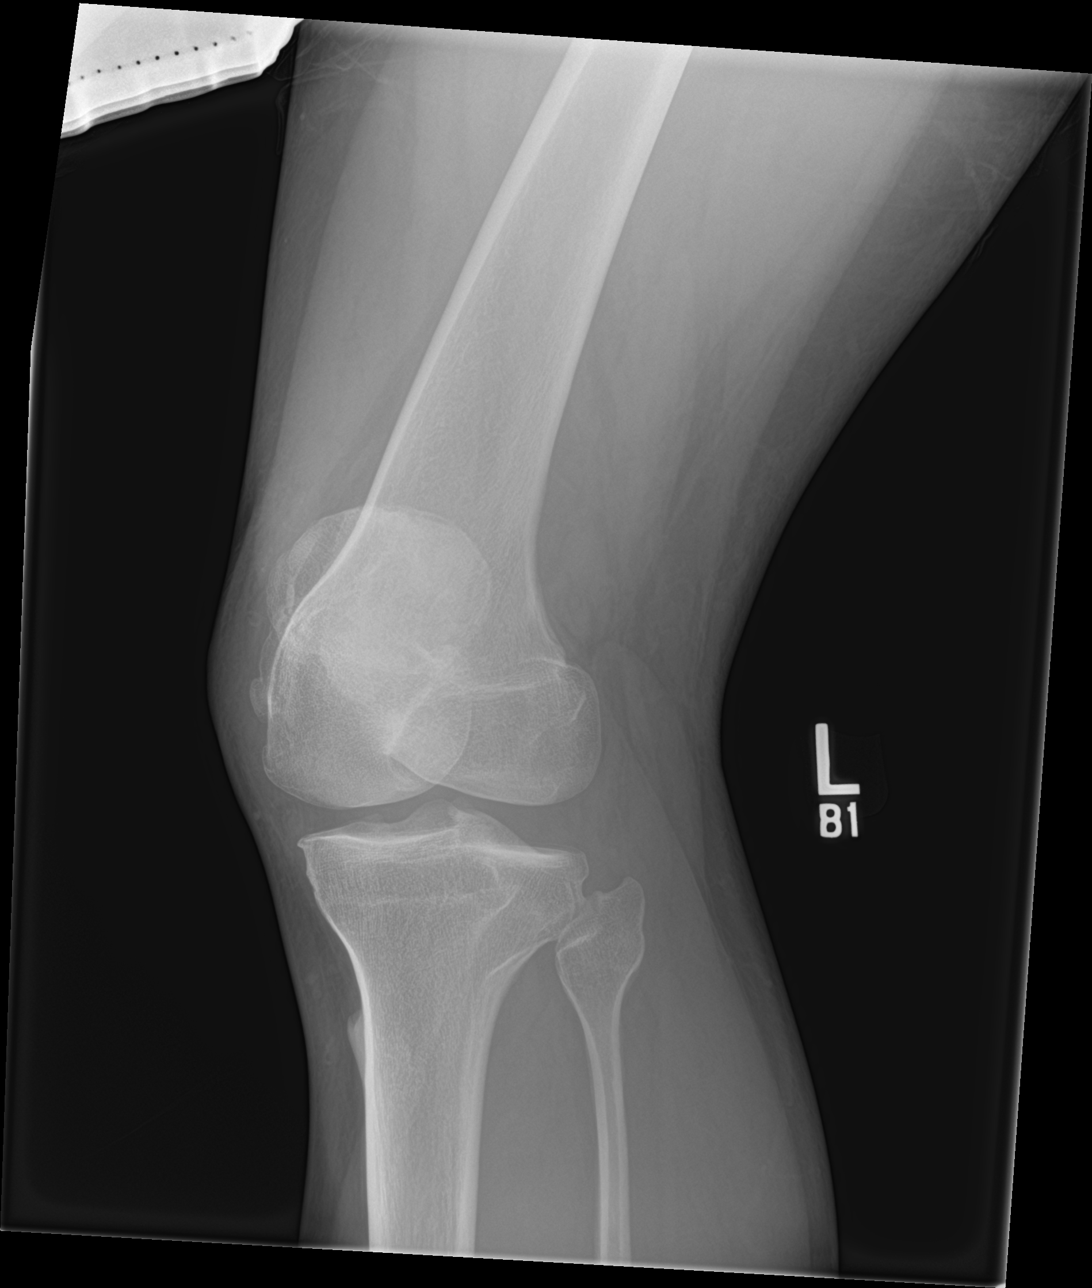

[4 of 4 positions shown; findings below may reference images not displayed]

FINDINGS: The joint spaces are maintained. Patellofemoral joint degenerative
changes, advanced for age. No acute fracture is identified. No joint
effusion.
IMPRESSION: No acute fracture or joint effusion.

Degenerative changes, advanced for age, possibly posttraumatic.

## 2020-05-12 ENCOUNTER — Ambulatory Visit
Admission: EM | Admit: 2020-05-12 | Discharge: 2020-05-12 | Disposition: A | Discharge status: Home / Self Care | Payer: Self-pay | Attending: Emergency Medicine | Admitting: Emergency Medicine

## 2020-05-12 ENCOUNTER — Other Ambulatory Visit: Payer: Self-pay

## 2020-05-12 DIAGNOSIS — J029 Acute pharyngitis, unspecified: Secondary | ICD-10-CM

## 2020-05-12 MED ORDER — CETIRIZINE HCL 10 MG PO TABS: 10.0000 mg | ORAL_TABLET | Freq: Every day | ORAL | 0 refills | Status: DC

## 2020-05-12 MED ORDER — FLUTICASONE PROPIONATE 50 MCG/ACT NA SUSP: 1.0000 | Freq: Every day | NASAL | 0 refills | Status: DC

## 2020-05-12 NOTE — ED Triage Notes (Signed)
Pt present pressure in both ears with chest congestion and headache. Symptoms started last night. Pt state that it started with a itchy throat.

## 2020-05-12 NOTE — Discharge Instructions (Signed)
Please continue Tylenol and/or Ibuprofen as needed for fever, pain.  May try warm salt water gargles, cepacol lozenges, throat spray, warm tea or water with lemon/honey, or OTC cold relief medicine for throat discomfort.   For congestion: take a daily anti-histamine like Zyrtec, Claritin, and a oral decongestant to help with post nasal drip that may be irritating your throat.   It is important to stay hydrated: drink plenty of fluids (primarily water) to keep your throat moisturized and help further relieve irritation/discomfort. 

## 2020-05-12 NOTE — ED Provider Notes (Signed)
[facility]    CSN: [id number] Arrival date & time: 05/12/20  [id number]      History   Chief Complaint Chief Complaint  Patient presents with  . Sore Throat  . Ear Fullness    HPI Ian Lewis is a 29 y.o. male   ST, HA since last night.  Had some GI upset which has resolved.  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  Is not Covid vaccinated.  Past Medical History:  Diagnosis Date  . Lower extremity weakness 12/17/2017  . Syncope   . Vision abnormalities     Patient Active Problem List   Diagnosis Date Noted  . Palpitations 01/23/2018  . Syncope 01/23/2018  . Lower extremity weakness 12/17/2017    Past Surgical History:  Procedure Laterality Date  . KNEE SURGERY    . LOOP RECORDER INSERTION N/A 02/05/2018   Procedure: LOOP RECORDER INSERTION;  Surgeon: Constance Haw, MD;  Location: [facility];  Service: Cardiovascular;  Laterality: N/A;  . MANDIBLE FRACTURE SURGERY         Home Medications    Prior to Admission medications   Medication Sig Start Date End Date Taking? Authorizing Provider  cetirizine (ZYRTEC ALLERGY) 10 MG tablet Take 1 tablet (10 mg total) by mouth daily. 05/12/20  Yes Hall-Potvin, [place], PA-C  fluticasone (FLONASE) 50 MCG/ACT nasal spray Place 1 spray into both nostrils daily. 05/12/20  Yes Hall-Potvin, [place], PA-C  ibuprofen (ADVIL,MOTRIN) 200 MG tablet Take 800 mg by mouth every 6 (six) hours as needed for fever or headache.    [provider]    Family History Family History  Problem Relation Age of Onset  . Atrial fibrillation Father     Social History Social History   Tobacco Use  . Smoking status: Never Smoker  . Smokeless tobacco: Never Used  Substance Use Topics  . Alcohol use: No  . Drug use: No     Allergies   Lorabid [loracarbef]   Review of Systems Review of Systems  Constitutional: Negative for fatigue and fever.  HENT: Positive for ear pain and sore throat.  Negative for congestion, dental problem, facial swelling, hearing loss, sinus pain, trouble swallowing and voice change.   Eyes: Negative for photophobia, pain and visual disturbance.  Respiratory: Negative for cough and shortness of breath.   Cardiovascular: Negative for chest pain and palpitations.  Gastrointestinal: Negative for diarrhea and vomiting.  Musculoskeletal: Negative for arthralgias and myalgias.  Neurological: Negative for dizziness and headaches.     Physical Exam Triage Vital Signs ED Triage Vitals  Enc Vitals Group     BP 05/12/20 1230 123/82     Pulse Rate 05/12/20 1230 68     Resp 05/12/20 1230 18     Temp 05/12/20 1230 97.7 F (36.5 C)     Temp Source 05/12/20 1230 Oral     SpO2 05/12/20 1230 98 %     Weight --      Height --      Head Circumference --      Peak Flow --      Pain Score 05/12/20 1229 2     Pain Loc --      Pain Edu? --      Excl. in [place]? --    No data found.  Updated Vital Signs BP 123/82 (BP Location: Left Arm)   Pulse 68   Temp 97.7 F (36.5 C) (Oral)   Resp 18   SpO2 98%  Visual Acuity Right Eye Distance:   Left Eye Distance:   Bilateral Distance:    Right Eye Near:   Left Eye Near:    Bilateral Near:     Physical Exam Constitutional:      General: He is not in acute distress.    Appearance: He is not toxic-appearing or diaphoretic.  HENT:     Head: Normocephalic and atraumatic.     Right Ear: Tympanic membrane and ear canal normal.     Left Ear: Tympanic membrane and ear canal normal.     Mouth/Throat:     Mouth: Mucous membranes are moist.     Pharynx: Oropharynx is clear.  Eyes:     General: No scleral icterus.    Conjunctiva/sclera: Conjunctivae normal.     Pupils: Pupils are equal, round, and reactive to light.  Neck:     Comments: Trachea midline, negative JVD Cardiovascular:     Rate and Rhythm: Normal rate and regular rhythm.  Pulmonary:     Effort: Pulmonary effort is normal. No respiratory distress.      Breath sounds: No wheezing.  Musculoskeletal:     Cervical back: Neck supple. No tenderness.  Lymphadenopathy:     Cervical: No cervical adenopathy.  Skin:    Capillary Refill: Capillary refill takes less than 2 seconds.     Coloration: Skin is not jaundiced or pale.     Findings: No rash.  Neurological:     Mental Status: He is alert and oriented to person, place, and time.      UC Treatments / Results  Labs (all labs ordered are listed, but only abnormal results are displayed) Labs Reviewed - No data to display  EKG   Radiology No results found.  Procedures Procedures (including critical care time)  Medications Ordered in UC Medications - No data to display  Initial Impression / Assessment and Plan / UC Course  I have reviewed the triage vital signs and the nursing notes.  Pertinent labs & imaging results that were available during my care of the patient were reviewed by me and considered in my medical decision making (see chart for details).     Afebrile, nontoxic in office today.  Exam unremarkable.  Likely viral versus environmental process.  Patient declined Covid testing.  Will treat supportively as below.  Return precautions discussed, pt verbalized understanding and is agreeable to plan. Final Clinical Impressions(s) / UC Diagnoses   Final diagnoses:  Sore throat     Discharge Instructions     Please continue Tylenol and/or Ibuprofen as needed for fever, pain.  May try warm salt water gargles, cepacol lozenges, throat spray, warm tea or water with lemon/honey, or OTC cold relief medicine for throat discomfort.   For congestion: take a daily anti-histamine like Zyrtec, Claritin, and a oral decongestant to help with post nasal drip that may be irritating your throat.   It is important to stay hydrated: drink plenty of fluids (primarily water) to keep your throat moisturized and help further relieve irritation/discomfort.     ED Prescriptions     Medication Sig Dispense Auth. Provider   cetirizine (ZYRTEC ALLERGY) 10 MG tablet Take 1 tablet (10 mg total) by mouth daily. 30 tablet Hall-Potvin, [place], PA-C   fluticasone (FLONASE) 50 MCG/ACT nasal spray Place 1 spray into both nostrils daily. 16 g Hall-Potvin, [place], PA-C     PDMP not reviewed this encounter.   Hall-Potvin, [place], [place] 05/12/20 1451

## 2020-08-21 ENCOUNTER — Other Ambulatory Visit: Payer: Self-pay

## 2020-08-21 ENCOUNTER — Ambulatory Visit (HOSPITAL_COMMUNITY)
Admission: EM | Admit: 2020-08-21 | Discharge: 2020-08-21 | Disposition: A | Discharge status: Home / Self Care | Payer: 59 | Attending: Emergency Medicine | Admitting: Emergency Medicine

## 2020-08-21 ENCOUNTER — Encounter (HOSPITAL_COMMUNITY): Payer: Self-pay

## 2020-08-21 DIAGNOSIS — R197 Diarrhea, unspecified: Secondary | ICD-10-CM | POA: Diagnosis present

## 2020-08-21 DIAGNOSIS — R112 Nausea with vomiting, unspecified: Secondary | ICD-10-CM | POA: Insufficient documentation

## 2020-08-21 DIAGNOSIS — R109 Unspecified abdominal pain: Secondary | ICD-10-CM | POA: Diagnosis not present

## 2020-08-21 DIAGNOSIS — R5383 Other fatigue: Secondary | ICD-10-CM | POA: Diagnosis present

## 2020-08-21 DIAGNOSIS — K529 Noninfective gastroenteritis and colitis, unspecified: Secondary | ICD-10-CM | POA: Diagnosis not present

## 2020-08-21 HISTORY — DX: Supraventricular tachycardia: I47.1

## 2020-08-21 HISTORY — DX: Supraventricular tachycardia, unspecified: I47.10

## 2020-08-21 LAB — COMPREHENSIVE METABOLIC PANEL
ALT: 69 U/L — ABNORMAL HIGH (ref 0–44)
AST: 47 U/L — ABNORMAL HIGH (ref 15–41)
Albumin: 4.1 g/dL (ref 3.5–5.0)
Alkaline Phosphatase: 79 U/L (ref 38–126)
Anion gap: 7 (ref 5–15)
BUN: 11 mg/dL (ref 6–20)
CO2: 25 mmol/L (ref 22–32)
Calcium: 9.5 mg/dL (ref 8.9–10.3)
Chloride: 104 mmol/L (ref 98–111)
Creatinine, Ser: 0.96 mg/dL (ref 0.61–1.24)
GFR, Estimated: >60 mL/min (ref >60)
Glucose, Bld: 104 mg/dL — ABNORMAL HIGH (ref 70–99)
Potassium: 3.8 mmol/L (ref 3.5–5.1)
Sodium: 136 mmol/L (ref 135–145)
Total Bilirubin: 1.4 mg/dL — ABNORMAL HIGH (ref 0.3–1.2)
Total Protein: 7.6 g/dL (ref 6.5–8.1)

## 2020-08-21 LAB — CBC WITH DIFFERENTIAL/PLATELET
Abs Immature Granulocytes: 0.05 10*3/uL (ref 0.00–0.07)
Basophils Absolute: 0 10*3/uL (ref 0.0–0.1)
Basophils Relative: 0 %
Eosinophils Absolute: 0.1 10*3/uL (ref 0.0–0.5)
Eosinophils Relative: 1 %
HCT: 46 % (ref 39.0–52.0)
Hemoglobin: 16.6 g/dL (ref 13.0–17.0)
Immature Granulocytes: 1 %
Lymphocytes Relative: 15 %
Lymphs Abs: 1.5 10*3/uL (ref 0.7–4.0)
MCH: 30.2 pg (ref 26.0–34.0)
MCHC: 36.1 g/dL — ABNORMAL HIGH (ref 30.0–36.0)
MCV: 83.8 fL (ref 80.0–100.0)
Monocytes Absolute: 0.8 10*3/uL (ref 0.1–1.0)
Monocytes Relative: 7 %
Neutro Abs: 8 10*3/uL — ABNORMAL HIGH (ref 1.7–7.7)
Neutrophils Relative %: 76 %
Platelets: 239 10*3/uL (ref 150–400)
RBC: 5.49 MIL/uL (ref 4.22–5.81)
RDW: 12.9 % (ref 11.5–15.5)
WBC: 10.4 10*3/uL (ref 4.0–10.5)
nRBC: 0 % (ref 0.0–0.2)

## 2020-08-21 LAB — POCT URINALYSIS DIPSTICK, ED / UC
Bilirubin Urine: NEGATIVE
Glucose, UA: NEGATIVE mg/dL
Ketones, ur: NEGATIVE mg/dL
Leukocytes,Ua: NEGATIVE
Nitrite: NEGATIVE
Protein, ur: NEGATIVE mg/dL
Specific Gravity, Urine: 1.01 (ref 1.005–1.030)
Urobilinogen, UA: 1 mg/dL (ref 0.0–1.0)
pH: 6 (ref 5.0–8.0)

## 2020-08-21 NOTE — Discharge Instructions (Addendum)
Continue to drink lots of fluids and rest.  If your symptoms worsen you may consider going to the emergency room for further evaluation including a CT scan and IV fluids.  Return or go to the Emergency Department if symptoms worsen or do not improve in the next few days.

## 2020-08-21 NOTE — ED Triage Notes (Addendum)
Pt c/o feeling weak/dehydrated, abdominal pain left lower quadrant, cold sweat last night. Pt also had one episode of vomiting and states vomit had blood in it. Symptoms have been ongoing for 2 days.

## 2020-08-21 NOTE — ED Provider Notes (Addendum)
[facility]    CSN: [id number] Arrival date & time: 08/21/20  1003      History   Chief Complaint Chief Complaint  Patient presents with  . Abdominal Pain  . Fatigue  . Emesis    HPI Ian Lewis is a 30 y.o. male.   Patient here for evaluation of left lower abdominal pain, weakness, and concern for dehydration.  Reports symptoms have been ongoing since Thursday and initially began with abdominal pain and vomiting.  Reports now having diarrhea daily.  Reports drinking plenty of fluids but "feels dehydrated".  Also reports feeling bloated.  Reports trying Tums with no relief. Denies any specific alleviating or aggravating factors.  Denies any fevers, chest pain, shortness of breath, N/V/D, numbness, tingling, weakness, abdominal pain, or headaches.   ROS: As per HPI, all other pertinent ROS negative   The history is provided by the patient.    Past Medical History:  Diagnosis Date  . Lower extremity weakness 12/17/2017  . SVT (supraventricular tachycardia) ([place])   . Syncope   . Vision abnormalities     Patient Active Problem List   Diagnosis Date Noted  . Palpitations 01/23/2018  . Syncope 01/23/2018  . Lower extremity weakness 12/17/2017    Past Surgical History:  Procedure Laterality Date  . KNEE SURGERY    . LOOP RECORDER INSERTION N/A 02/05/2018   Procedure: LOOP RECORDER INSERTION;  Surgeon: Constance Haw, MD;  Location: [facility];  Service: Cardiovascular;  Laterality: N/A;  . MANDIBLE FRACTURE SURGERY         Home Medications    Prior to Admission medications   Medication Sig Start Date End Date Taking? Authorizing Provider  cetirizine (ZYRTEC ALLERGY) 10 MG tablet Take 1 tablet (10 mg total) by mouth daily. 05/12/20   Hall-Potvin, [place], PA-C  fluticasone (FLONASE) 50 MCG/ACT nasal spray Place 1 spray into both nostrils daily. 05/12/20   Hall-Potvin, [place], PA-C  ibuprofen (ADVIL,MOTRIN) 200 MG tablet Take 800 mg by  mouth every 6 (six) hours as needed for fever or headache.    [provider]    Family History Family History  Problem Relation Age of Onset  . Atrial fibrillation Father     Social History Social History   Tobacco Use  . Smoking status: Never Smoker  . Smokeless tobacco: Never Used  Substance Use Topics  . Alcohol use: No  . Drug use: No     Allergies   Lorabid [loracarbef]   Review of Systems Review of Systems  Gastrointestinal: Positive for abdominal distention, diarrhea, nausea and vomiting.  All other systems reviewed and are negative.    Physical Exam Triage Vital Signs ED Triage Vitals  Enc Vitals Group     BP 08/21/20 1118 125/79     Pulse Rate 08/21/20 1118 87     Resp 08/21/20 1118 18     Temp 08/21/20 1118 98.6 F (37 C)     Temp Source 08/21/20 1118 Oral     SpO2 08/21/20 1118 99 %     Weight --      Height --      Head Circumference --      Peak Flow --      Pain Score 08/21/20 1116 4     Pain Loc --      Pain Edu? --      Excl. in [place]? --    No data found.  Updated Vital Signs BP 125/79  Pulse 87   Temp 98.6 F (37 C) (Oral)   Resp 18   SpO2 99%   Visual Acuity Right Eye Distance:   Left Eye Distance:   Bilateral Distance:    Right Eye Near:   Left Eye Near:    Bilateral Near:     Physical Exam Vitals and nursing note reviewed.  Constitutional:      General: He is not in acute distress.    Appearance: Normal appearance. He is not ill-appearing, toxic-appearing or diaphoretic.  HENT:     Head: Normocephalic and atraumatic.  Eyes:     Conjunctiva/sclera: Conjunctivae normal.  Cardiovascular:     Rate and Rhythm: Normal rate.     Pulses: Normal pulses.  Pulmonary:     Effort: Pulmonary effort is normal.  Abdominal:     General: Abdomen is protuberant. Bowel sounds are normal.     Palpations: Abdomen is soft. There is no shifting dullness, hepatomegaly, splenomegaly, mass or pulsatile mass.     Tenderness:  There is abdominal tenderness in the periumbilical area, suprapubic area and left lower quadrant. There is no right CVA tenderness, left CVA tenderness, guarding or rebound. Negative signs include Murphy's sign, Rovsing's sign, McBurney's sign, psoas sign and obturator sign.     Hernia: No hernia is present.  Musculoskeletal:        General: Normal range of motion.     Cervical back: Normal range of motion.  Skin:    General: Skin is warm and dry.  Neurological:     General: No focal deficit present.     Mental Status: He is alert and oriented to person, place, and time.  Psychiatric:        Mood and Affect: Mood normal.      UC Treatments / Results  Labs (all labs ordered are listed, but only abnormal results are displayed) Labs Reviewed  POCT URINALYSIS DIPSTICK, ED / UC - Abnormal; Notable for the following components:      Result Value   Hgb urine dipstick MODERATE (*)    All other components within normal limits  CBC WITH DIFFERENTIAL/PLATELET  COMPREHENSIVE METABOLIC PANEL    EKG   Radiology No results found.  Procedures Procedures (including critical care time)  Medications Ordered in UC Medications - No data to display  Initial Impression / Assessment and Plan / UC Course  I have reviewed the triage vital signs and the nursing notes.  Pertinent labs & imaging results that were available during my care of the patient were reviewed by me and considered in my medical decision making (see chart for details).     Assessment negative for red flags or concerns including appendicitis and cholecystitis.  Based on generalized abdominal pain likely viral in cause.  Dizziness likely related to vomiting and diarrhea.  EKG with normal rate and normal sinus rhythm.  Urinalysis with hemoglobin but otherwise negative.  No concerns for urinary tract infection.  CBC and CMP pending.  Encourage fluid and rest.  Consider follow-up with the emergency room if symptoms worsen or do not  improve in the next few days.   Final Clinical Impressions(s) / UC Diagnoses   Final diagnoses:  Gastroenteritis  Nausea vomiting and diarrhea  Other fatigue     Discharge Instructions     Continue to drink lots of fluids and rest.  If your symptoms worsen you may consider going to the emergency room for further evaluation including a CT scan and IV fluids.  Return  or go to the Emergency Department if symptoms worsen or do not improve in the next few days.      ED Prescriptions    None     PDMP not reviewed this encounter.   Pearson Forster, NP 08/21/20 1206    Pearson Forster, NP 08/21/20 1220

## 2021-07-21 ENCOUNTER — Encounter (HOSPITAL_COMMUNITY): Payer: Self-pay

## 2021-07-21 ENCOUNTER — Emergency Department (HOSPITAL_COMMUNITY)
Admission: EM | Admit: 2021-07-21 | Discharge: 2021-07-21 | Disposition: A | Discharge status: Home / Self Care | Payer: Commercial Managed Care - PPO | Attending: Emergency Medicine | Admitting: Emergency Medicine

## 2021-07-21 ENCOUNTER — Ambulatory Visit (HOSPITAL_COMMUNITY)
Admission: EM | Admit: 2021-07-21 | Discharge: 2021-07-21 | Disposition: A | Discharge status: Home / Self Care | Payer: Commercial Managed Care - PPO | Attending: Family Medicine | Admitting: Family Medicine

## 2021-07-21 ENCOUNTER — Other Ambulatory Visit: Payer: Self-pay

## 2021-07-21 ENCOUNTER — Emergency Department (HOSPITAL_COMMUNITY): Payer: Commercial Managed Care - PPO

## 2021-07-21 DIAGNOSIS — R1030 Lower abdominal pain, unspecified: Secondary | ICD-10-CM | POA: Diagnosis not present

## 2021-07-21 DIAGNOSIS — R61 Generalized hyperhidrosis: Secondary | ICD-10-CM | POA: Insufficient documentation

## 2021-07-21 DIAGNOSIS — R1031 Right lower quadrant pain: Secondary | ICD-10-CM | POA: Insufficient documentation

## 2021-07-21 DIAGNOSIS — K920 Hematemesis: Secondary | ICD-10-CM | POA: Insufficient documentation

## 2021-07-21 DIAGNOSIS — R1033 Periumbilical pain: Secondary | ICD-10-CM | POA: Insufficient documentation

## 2021-07-21 DIAGNOSIS — R1032 Left lower quadrant pain: Secondary | ICD-10-CM | POA: Insufficient documentation

## 2021-07-21 DIAGNOSIS — R231 Pallor: Secondary | ICD-10-CM | POA: Insufficient documentation

## 2021-07-21 LAB — COMPREHENSIVE METABOLIC PANEL
ALT: 14 U/L (ref 0–44)
AST: 18 U/L (ref 15–41)
Albumin: 4 g/dL (ref 3.5–5.0)
Alkaline Phosphatase: 66 U/L (ref 38–126)
Anion gap: 11 (ref 5–15)
BUN: 12 mg/dL (ref 6–20)
CO2: 23 mmol/L (ref 22–32)
Calcium: 8.9 mg/dL (ref 8.9–10.3)
Chloride: 104 mmol/L (ref 98–111)
Creatinine, Ser: 0.93 mg/dL (ref 0.61–1.24)
GFR, Estimated: >60 mL/min (ref >60)
Glucose, Bld: 101 mg/dL — ABNORMAL HIGH (ref 70–99)
Potassium: 3.9 mmol/L (ref 3.5–5.1)
Sodium: 138 mmol/L (ref 135–145)
Total Bilirubin: 1 mg/dL (ref 0.3–1.2)
Total Protein: 6.7 g/dL (ref 6.5–8.1)

## 2021-07-21 LAB — LIPASE, BLOOD: Lipase: 36 U/L (ref 11–51)

## 2021-07-21 LAB — POCT URINALYSIS DIPSTICK, ED / UC
Bilirubin Urine: NEGATIVE
Glucose, UA: NEGATIVE mg/dL
Ketones, ur: NEGATIVE mg/dL
Leukocytes,Ua: NEGATIVE
Nitrite: NEGATIVE
Protein, ur: NEGATIVE mg/dL
Specific Gravity, Urine: 1.025 (ref 1.005–1.030)
Urobilinogen, UA: 0.2 mg/dL (ref 0.0–1.0)
pH: 6 (ref 5.0–8.0)

## 2021-07-21 LAB — CBC WITH DIFFERENTIAL/PLATELET
Abs Immature Granulocytes: 0.03 10*3/uL (ref 0.00–0.07)
Basophils Absolute: 0.1 10*3/uL (ref 0.0–0.1)
Basophils Relative: 1 %
Eosinophils Absolute: 0.4 10*3/uL (ref 0.0–0.5)
Eosinophils Relative: 4 %
HCT: 46.9 % (ref 39.0–52.0)
Hemoglobin: 16.7 g/dL (ref 13.0–17.0)
Immature Granulocytes: 0 %
Lymphocytes Relative: 24 %
Lymphs Abs: 2.4 10*3/uL (ref 0.7–4.0)
MCH: 29.9 pg (ref 26.0–34.0)
MCHC: 35.6 g/dL (ref 30.0–36.0)
MCV: 84.1 fL (ref 80.0–100.0)
Monocytes Absolute: 0.7 10*3/uL (ref 0.1–1.0)
Monocytes Relative: 7 %
Neutro Abs: 6.3 10*3/uL (ref 1.7–7.7)
Neutrophils Relative %: 64 %
Platelets: 268 10*3/uL (ref 150–400)
RBC: 5.58 MIL/uL (ref 4.22–5.81)
RDW: 12.1 % (ref 11.5–15.5)
WBC: 9.8 10*3/uL (ref 4.0–10.5)
nRBC: 0 % (ref 0.0–0.2)

## 2021-07-21 MED ORDER — IOHEXOL 300 MG/ML  SOLN
100.0000 mL | Freq: Once | INTRAMUSCULAR | Status: AC | PRN
  Administered 2021-07-21: 100 mL via INTRAVENOUS

## 2021-07-21 NOTE — ED Provider Notes (Signed)
[facility] EMERGENCY DEPARTMENT Provider Note   CSN: [id number] Arrival date & time: 07/21/21  1824     History  Chief Complaint  Patient presents with   Abdominal Pain    MD SMOLA is a 31 y.o. male.  31 year old male with no past medical history presents to the ED with a chief complaint of left lower quadrant pain that began this morning around 5 AM.  Patient was previously evaluated urgent care and sent to the ED for further evaluation.  He describes a left lower quadrant cramping, "warmth "sensation to his lower abdomen which began along the left lower quadrant, later radiated below his navel and now has radiated to the right lower quadrant.  He also endorses multiple episodes of nonbilious, bloody emesis.  He is unsure whether this was blood, but reports a red substance noted on his emesis.  He tried to rest, however had a second episode of vomiting while in his car, this was accompanied by diaphoresis, became very clammy.  His last meal was around 2 PM, although he afterwards vomited.  He denies any prior surgical invention of his abdomen.  He denies any fever, chills, no blood in his stool.  Last bowel movement consistent of diarrhea which was this morning. No urinary symptoms.   The history is provided by the patient and medical records.  Abdominal Pain Pain location:  Periumbilical, suprapubic and LLQ Pain quality: cramping   Pain radiates to:  Does not radiate Pain severity:  Moderate Onset quality:  Gradual Duration:  12 hours Timing:  Constant Progression:  Worsening Chronicity:  New Context: not alcohol use, not awakening from sleep, not previous surgeries and not recent illness   Relieved by:  Nothing Worsened by:  Palpation and position changes Ineffective treatments:  None tried Associated symptoms: nausea and vomiting   Associated symptoms: no chest pain, no chills, no fever, no shortness of breath and no sore throat       Home  Medications Prior to Admission medications   Medication Sig Start Date End Date Taking? Authorizing Provider  cetirizine (ZYRTEC ALLERGY) 10 MG tablet Take 1 tablet (10 mg total) by mouth daily. 05/12/20   Hall-Potvin, [place], PA-C  fluticasone (FLONASE) 50 MCG/ACT nasal spray Place 1 spray into both nostrils daily. 05/12/20   Hall-Potvin, [place], PA-C  ibuprofen (ADVIL,MOTRIN) 200 MG tablet Take 800 mg by mouth every 6 (six) hours as needed for fever or headache.    [provider]      Allergies    Loracarbef    Review of Systems   Review of Systems  Constitutional:  Negative for chills and fever.  HENT:  Negative for sore throat.   Respiratory:  Negative for shortness of breath.   Cardiovascular:  Negative for chest pain.  Gastrointestinal:  Positive for abdominal pain, nausea and vomiting. Negative for blood in stool.  Genitourinary:  Negative for flank pain.  Musculoskeletal:  Negative for back pain.  Neurological:  Negative for light-headedness and headaches.  All other systems reviewed and are negative.  Physical Exam Updated Vital Signs BP 127/87 (BP Location: Right Arm)    Pulse 78    Temp 97.8 F (36.6 C) (Oral)    Resp 18    Ht '5\' 6"'$  (1.676 m)    Wt 93 kg    SpO2 100%    BMI 33.09 kg/m  Physical Exam Vitals and nursing note reviewed.  Constitutional:      Appearance: He is well-developed.  HENT:     Head: Normocephalic and atraumatic.  Cardiovascular:     Rate and Rhythm: Normal rate.  Pulmonary:     Effort: Pulmonary effort is normal.     Breath sounds: No wheezing.  Abdominal:     General: Abdomen is flat. Bowel sounds are decreased.     Palpations: Abdomen is soft.     Tenderness: There is abdominal tenderness in the right lower quadrant. There is guarding. There is no right CVA tenderness or left CVA tenderness.     Hernia: No hernia is present.  Skin:    General: Skin is warm and dry.  Neurological:     Mental Status: He is alert and  oriented to person, place, and time.    ED Results / Procedures / Treatments   Labs (all labs ordered are listed, but only abnormal results are displayed) Labs Reviewed  COMPREHENSIVE METABOLIC PANEL - Abnormal; Notable for the following components:      Result Value   Glucose, Bld 101 (*)    All other components within normal limits  LIPASE, BLOOD  CBC WITH DIFFERENTIAL/PLATELET    EKG None  Radiology CT ABDOMEN PELVIS W CONTRAST  Result Date: 07/21/2021 CLINICAL DATA:  Left lower quadrant abdominal pain for 17 hours. Vomiting. EXAM: CT ABDOMEN AND PELVIS WITH CONTRAST TECHNIQUE: Multidetector CT imaging of the abdomen and pelvis was performed using the standard protocol following bolus administration of intravenous contrast. RADIATION DOSE REDUCTION: This exam was performed according to the departmental dose-optimization program which includes automated exposure control, adjustment of the mA and/or kV according to patient size and/or use of iterative reconstruction technique. CONTRAST:  165m OMNIPAQUE IOHEXOL 300 MG/ML  SOLN COMPARISON:  None. FINDINGS: Lower chest: Lung bases are clear. Hepatobiliary: No focal liver abnormality is seen. No gallstones, gallbladder wall thickening, or biliary dilatation. Pancreas: Unremarkable. No pancreatic ductal dilatation or surrounding inflammatory changes. Spleen: Normal in size without focal abnormality. Adrenals/Urinary Tract: Adrenal glands are unremarkable. Kidneys are normal, without renal calculi, focal lesion, or hydronephrosis. Bladder is unremarkable. Stomach/Bowel: Stomach is within normal limits. Appendix appears normal. No evidence of bowel wall thickening, distention, or inflammatory changes. Scattered colonic diverticula without evidence of acute diverticulitis. Vascular/Lymphatic: No significant vascular findings are present. No enlarged abdominal or pelvic lymph nodes. Reproductive: Prostate is unremarkable. Other: No abdominal wall  hernia or abnormality. No abdominopelvic ascites. 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 No destructive bone lesions. IMPRESSION: 1. No acute process demonstrated in the abdomen or pelvis. No evidence of bowel obstruction or inflammation. 2. Colonic diverticula without evidence of acute diverticulitis. 3. L5-S1 spondylolysis. Electronically Signed   By: WLucienne CapersM.D.   On: 07/21/2021 22:27    Procedures Procedures    Medications Ordered in ED Medications  iohexol (OMNIPAQUE) 300 MG/ML solution 100 mL (100 mLs Intravenous Contrast Given 07/21/21 2222)    ED Course/ Medical Decision Making/ A&P                           Medical Decision Making Amount and/or Complexity of Data Reviewed Labs: ordered. Radiology: ordered.  Risk Prescription drug management.  This patient presents to the ED for concern of lower abdominal pain, this involves a number of treatment options, and is a complaint that carries with it a high risk of complications and morbidity.  The differential diagnosis includes appendicitis, diverticulitis, gastroenteritis, renal colic.    Co morbidities: Discussed in HPI   Brief  History:  Healthy 31 year old presents to the ED with lower abdominal pain that began this morning around 5 AM.  This has been accompanied by 2 episodes of nonbilious, bloody emesis, also with some diaphoresis, clammy.  Evaluated at urgent care and referred to the ED for further evaluation.  No improvement in symptoms despite medication, exacerbated with palpation, pressure applied with sitting.  EMR reviewed including pt PMHx, past surgical history and past visits to ER.   See HPI for more details   Lab Tests:  I ordered and independently interpreted labs.  The pertinent results include:    I personally reviewed all laboratory work and imaging. Metabolic panel without any acute abnormality specifically kidney function within normal limits  and no significant electrolyte abnormalities. CBC without leukocytosis or significant anemia.  Lipase levels within normal limits.  UA performed at urgent care which did show small amount of hemoglobin, no nitrites or leukocytes to suggest infection.    Imaging Studies:    CT Abdomen showed:  1. No acute process demonstrated in the abdomen or pelvis. No  evidence of bowel obstruction or inflammation.  2. Colonic diverticula without evidence of acute diverticulitis.  3. L5-S1 spondylolysis.       Medicines ordered:  I ordered medication including none as he reported pain is not as severe when not moving Reevaluation of the patient after these medicines showed that the patient stayed the same I have reviewed the patients home medicines and have made adjustments as needed  Reevaluation:  After the interventions noted above I re-evaluated patient and found that they have :stayed the same   Social Determinants of Health:  The patient's social determinants of health were a factor in the care of this patient    Problem List / ED Course:  Healthy 31 year old with nausea, vomiting, diarrhea for the past day.  Also reports 1 episode of bloody emesis, although there was more so streaking.  Some lower abdominal cramping without any improvement.  Evaluated at urgent care and sent to the ED for further management.  CT on today's visit was unremarkable, patient reports feeling overall the same, we discussed likely viral enteritis in nature.  We will continue to push fluids, bowel rest at home.  He is agreeable to plan and treatment.   Dispostion:  After consideration of the diagnostic results and the patients response to treatment, I feel that the patent would benefit from continuing hydration at home, follow-up with PCP as needed.    Portions of this note were generated with Lobbyist. Dictation errors may occur despite best attempts at proofreading.  Final Clinical  Impression(s) / ED Diagnoses Final diagnoses:  Left lower quadrant abdominal pain    Rx / DC Orders ED Discharge Orders     None         Janeece Fitting, PA-C 07/21/21 2243    Pattricia Boss, MD 07/22/21 1510

## 2021-07-21 NOTE — ED Triage Notes (Addendum)
Reports lower midline back pain that has moved to the LLQ and lower abdominal pain worsening since 0500 today. Several episodes of vomiting with small amount of blood and diarrhea.  ?

## 2021-07-21 NOTE — ED Triage Notes (Signed)
Pt reports vomiting and stomach pain x 2 days. Pt states he has been coughing up blood when vomiting.  ? ?States he has lower abdominal pain.  ?

## 2021-07-21 NOTE — Discharge Instructions (Signed)
We discussed the results of your CT findings on today's visit. ? ?You will need to follow-up with your primary care physician as needed. ? ?Continue to hydrate with plenty of fluids.  Return to the ED if your symptoms worsen. ?

## 2021-07-23 NOTE — ED Provider Notes (Signed)
?[facility] ? ? ?[id number] ?07/21/21 Arrival Time: 6734 ? ?ASSESSMENT & PLAN: ? ?1. Lower abdominal pain   ? ?Given location, worsening pain, with emesis discussed that I cannot r/o early appendicitis here. Elects ED evaluation. To ED via private vehicle; stable upon d/c. Symptoms not consistent with kidney stone. No h/o diverticulitis. ? ? Follow-up Information   ? ? Go to  [facility].   ?Specialty: Emergency Medicine ?Contact information: ?[address] ?[phone] mc ?[place] ?[phone] ? ?  ?  ? ?  ?  ? ?  ? ? ?Reviewed expectations re: course of current medical issues. Questions answered. ?Outlined signs and symptoms indicating need for more acute intervention. ?Patient verbalized understanding. ?After Visit Summary given. ? ? ?SUBJECTIVE: ?History from: patient. ?Ian Lewis is a 31 y.o. male who presents with complaint of lower abdominal pain; gradual onset; first noted within the past 6-12 hours. Pain is desc as cramping and non-radiating. With a few episodes of non-bilious, non-bloody emesis today; overall decreased appetitie. No specific diarrhea noted. No fever. Ambulatory. Does feel chilled here. Normal urination. No tx PTA. ? ?Past Surgical History:  ?Procedure Laterality Date  ? KNEE SURGERY    ? LOOP RECORDER INSERTION N/A 02/05/2018  ? Procedure: LOOP RECORDER INSERTION;  Surgeon: Constance Haw, MD;  Location: [facility];  Service: Cardiovascular;  Laterality: N/A;  ? MANDIBLE FRACTURE SURGERY    ? ? ? ?OBJECTIVE: ? ?Vitals:  ? 07/21/21 1742  ?BP: (!) 139/94  ?Pulse: (!) 104  ?Resp: 17  ?Temp: 97.9 ?F (36.6 ?C)  ?TempSrc: Oral  ?SpO2: 100%  ?  ?Slight tachycardia noted. ? ?General appearance: alert, oriented, no acute distress but appears uncomfortable ?HEENT: [place]; AT; oropharynx moist ?Lungs: unlabored respirations ?Abdomen: soft; without distention; moderate and dpoorly localized tenderness to  palpation over lower abdomen ; normal bowel sounds; without masses or organomegaly; without guarding or rebound tenderness ?Back: without reported CVA tenderness; FROM at waist ?Extremities: without LE edema; symmetrical; without gross deformities ?Skin: warm and dry ?Neurologic: normal gait ?Psychological: alert and cooperative; normal mood and affect ? ?Labs: ?Results for orders placed or performed during the hospital encounter of 07/21/21  ?POC Urinalysis dipstick  ?Result Value Ref Range  ? Glucose, UA NEGATIVE NEGATIVE mg/dL  ? Bilirubin Urine NEGATIVE NEGATIVE  ? Ketones, ur NEGATIVE NEGATIVE mg/dL  ? Specific Gravity, Urine 1.025 1.005 - 1.030  ? Hgb urine dipstick SMALL (A) NEGATIVE  ? pH 6.0 5.0 - 8.0  ? Protein, ur NEGATIVE NEGATIVE mg/dL  ? Urobilinogen, UA 0.2 0.0 - 1.0 mg/dL  ? Nitrite NEGATIVE NEGATIVE  ? Leukocytes,Ua NEGATIVE NEGATIVE  ? ?Labs Reviewed  ?POCT URINALYSIS DIPSTICK, ED / UC - Abnormal; Notable for the following components:  ?    Result Value  ? Hgb urine dipstick SMALL (*)   ? All other components within normal limits  ? ? ? ?Allergies  ?Allergen Reactions  ? Loracarbef Hives  ?  "hives"  ? ?                                            ?Past Medical History:  ?Diagnosis Date  ? Lower extremity weakness 12/17/2017  ? SVT (supraventricular tachycardia) ([place])   ? Syncope   ? Vision abnormalities   ? ? ?Social History  ? ?Socioeconomic  History  ? Marital status: Single  ?  Spouse name: Not on file  ? Number of children: Not on file  ? Years of education: Not on file  ? Highest education level: Not on file  ?Occupational History  ? Not on file  ?Tobacco Use  ? Smoking status: Never  ? Smokeless tobacco: Never  ?Substance and Sexual Activity  ? Alcohol use: No  ? Drug use: No  ? Sexual activity: Not on file  ?Other Topics Concern  ? Not on file  ?Social History Narrative  ? Not on file  ? ?Social Determinants of Health  ? ?Financial Resource Strain: Not on file  ?Food Insecurity: Not on file   ?Transportation Needs: Not on file  ?Physical Activity: Not on file  ?Stress: Not on file  ?Social Connections: Not on file  ?Intimate Partner Violence: Not on file  ? ? ?Family History  ?Problem Relation Age of Onset  ? Atrial fibrillation Father   ? ?  ?Vanessa Kick, MD ?07/23/21 0932 ? ?

## 2021-08-19 ENCOUNTER — Other Ambulatory Visit: Payer: Self-pay

## 2021-08-19 ENCOUNTER — Emergency Department (HOSPITAL_COMMUNITY): Payer: Commercial Managed Care - PPO

## 2021-08-19 ENCOUNTER — Encounter (HOSPITAL_COMMUNITY): Payer: Self-pay | Admitting: Emergency Medicine

## 2021-08-19 ENCOUNTER — Emergency Department (HOSPITAL_COMMUNITY)
Admission: EM | Admit: 2021-08-19 | Discharge: 2021-08-19 | Disposition: A | Discharge status: Home / Self Care | Payer: Commercial Managed Care - PPO | Attending: Emergency Medicine | Admitting: Emergency Medicine

## 2021-08-19 DIAGNOSIS — R002 Palpitations: Secondary | ICD-10-CM | POA: Insufficient documentation

## 2021-08-19 DIAGNOSIS — R0602 Shortness of breath: Secondary | ICD-10-CM | POA: Diagnosis not present

## 2021-08-19 LAB — BASIC METABOLIC PANEL
Anion gap: 6 (ref 5–15)
BUN: 9 mg/dL (ref 6–20)
CO2: 25 mmol/L (ref 22–32)
Calcium: 9 mg/dL (ref 8.9–10.3)
Chloride: 107 mmol/L (ref 98–111)
Creatinine, Ser: 0.9 mg/dL (ref 0.61–1.24)
GFR, Estimated: >60 mL/min (ref >60)
Glucose, Bld: 131 mg/dL — ABNORMAL HIGH (ref 70–99)
Potassium: 3.9 mmol/L (ref 3.5–5.1)
Sodium: 138 mmol/L (ref 135–145)

## 2021-08-19 LAB — CBC
HCT: 47.2 % (ref 39.0–52.0)
Hemoglobin: 16.2 g/dL (ref 13.0–17.0)
MCH: 29.1 pg (ref 26.0–34.0)
MCHC: 34.3 g/dL (ref 30.0–36.0)
MCV: 84.7 fL (ref 80.0–100.0)
Platelets: 265 10*3/uL (ref 150–400)
RBC: 5.57 MIL/uL (ref 4.22–5.81)
RDW: 12.3 % (ref 11.5–15.5)
WBC: 9.3 10*3/uL (ref 4.0–10.5)
nRBC: 0 % (ref 0.0–0.2)

## 2021-08-19 LAB — TROPONIN I (HIGH SENSITIVITY): Troponin I (High Sensitivity): 3 ng/L (ref <18)

## 2021-08-19 NOTE — ED Triage Notes (Signed)
Pt states that he had an episode of feeling like his heart was racing at work [employer]. EMS checked him out and stated that there was a small tracing of Afib on their monitor. Pt has hx of svt ?

## 2021-08-19 NOTE — ED Notes (Signed)
Ambulated pt with pulse ox, pt had steady gait and had no complaints of dizziness or shortness of breath. Pt heartrate went to 115 while walking ?

## 2021-08-19 NOTE — ED Provider Notes (Signed)
?[facility] ?Provider Note ? ? ?CSN: [id number] ?Arrival date & time: 08/19/21  2122 ? ?  ? ?History ? ?Chief Complaint  ?Patient presents with  ? Palpitations  ? ? ?Ian Lewis is a 31 y.o. male. ? ?Ian Lewis is a 31 y.o. male with a history of SVT, syncope and palpitations, who presents to the emergency department for evaluation of palpitations.  He reports earlier this evening he noticed a heart racing sensation while he was at work.  EMS came to check him out and they thought they saw a brief episode of A-fib on the monitor and encouraged him to come and get checked out.  He reports that he has a known history of SVT and has previously followed with Dr. Curt Bears for this, had a loop recorder put in place but this was supposed to be removed 2 years ago and is not currently functioning.  He reports that he notices the symptoms of heart racing primarily when he is up walking around, but with rest he feels like his heart rate returns to normal.  He occasionally feels short of breath when his heart is racing, denies lightheadedness or syncope.  No chest pain.  ? ?The history is provided by the patient.  ? ?  ? ?Home Medications ?Prior to Admission medications   ?Medication Sig Start Date End Date Taking? Authorizing Provider  ?cetirizine (ZYRTEC ALLERGY) 10 MG tablet Take 1 tablet (10 mg total) by mouth daily. 05/12/20   Hall-Potvin, [place], PA-C  ?fluticasone (FLONASE) 50 MCG/ACT nasal spray Place 1 spray into both nostrils daily. 05/12/20   Hall-Potvin, [place], PA-C  ?ibuprofen (ADVIL,MOTRIN) 200 MG tablet Take 800 mg by mouth every 6 (six) hours as needed for fever or headache.    [provider]  ?   ? ?Allergies    ?Loracarbef   ? ?Review of Systems   ?Review of Systems  ?Constitutional:  Negative for chills and fever.  ?HENT: Negative.    ?Respiratory:  Negative for cough and shortness of breath.   ?Cardiovascular:  Positive for palpitations.  Negative for chest pain and leg swelling.  ?Gastrointestinal:  Negative for abdominal pain, nausea and vomiting.  ?Neurological:  Negative for syncope and light-headedness.  ?All other systems reviewed and are negative. ? ?Physical Exam ?Updated Vital Signs ?BP 115/78   Pulse 76   Temp 98.5 ?F (36.9 ?C) (Oral)   Resp 17   Ht '5\' 6"'$  (1.676 m)   Wt 93 kg   SpO2 95%   BMI 33.09 kg/m?  ?Physical Exam ?Vitals and nursing note reviewed.  ?Constitutional:   ?   General: He is not in acute distress. ?   Appearance: Normal appearance. He is well-developed. He is not diaphoretic.  ?HENT:  ?   Head: Normocephalic and atraumatic.  ?Eyes:  ?   General:     ?   Right eye: No discharge.     ?   Left eye: No discharge.  ?   Pupils: Pupils are equal, round, and reactive to light.  ?Cardiovascular:  ?   Rate and Rhythm: Normal rate and regular rhythm.  ?   Pulses: Normal pulses.  ?   Heart sounds: Normal heart sounds.  ?Pulmonary:  ?   Effort: Pulmonary effort is normal. No respiratory distress.  ?   Breath sounds: Normal breath sounds. No wheezing or rales.  ?   Comments: Respirations equal and unlabored, patient able to speak in full sentences,  lungs clear to auscultation bilaterally  ?Abdominal:  ?   General: Bowel sounds are normal. There is no distension.  ?   Palpations: Abdomen is soft. There is no mass.  ?   Tenderness: There is no abdominal tenderness. There is no guarding.  ?   Comments: Abdomen soft, nondistended, nontender to palpation in all quadrants without guarding or peritoneal signs  ?Musculoskeletal:     ?   General: No deformity.  ?   Cervical back: Neck supple.  ?   Right lower leg: No edema.  ?   Left lower leg: No edema.  ?Skin: ?   General: Skin is warm and dry.  ?   Capillary Refill: Capillary refill takes less than 2 seconds.  ?Neurological:  ?   Mental Status: He is alert and oriented to person, place, and time.  ?   Coordination: Coordination normal.  ?   Comments: Speech is clear, able to follow  commands ?Moves extremities without ataxia, coordination intact  ?Psychiatric:     ?   Mood and Affect: Mood normal.     ?   Behavior: Behavior normal.  ? ? ?ED Results / Procedures / Treatments   ?Labs ?(all labs ordered are listed, but only abnormal results are displayed) ?Labs Reviewed  ?BASIC METABOLIC PANEL - Abnormal; Notable for the following components:  ?    Result Value  ? Glucose, Bld 131 (*)   ? All other components within normal limits  ?CBC  ?TROPONIN I (HIGH SENSITIVITY)  ? ? ?EKG ?EKG Interpretation ? ?Date/Time:  Sunday August 19 2021 21:31:39 EDT ?Ventricular Rate:  77 ?PR Interval:  122 ?QRS Duration: 80 ?QT Interval:  346 ?QTC Calculation: 391 ?R Axis:   73 ?Text Interpretation: Normal sinus rhythm Normal ECG When compared with ECG of 21-Aug-2020 11:53, No significant change since last tracing Confirmed by Aletta Edouard [id number]) on 08/19/2021 10:22:38 PM ? ?Radiology ?DG Chest 2 View ? ?Result Date: 08/19/2021 ?CLINICAL DATA:  Chest pain EXAM: CHEST - 2 VIEW COMPARISON:  03/12/2018 FINDINGS: The heart size and mediastinal contours are within normal limits. Both lungs are clear. The visualized skeletal structures are unremarkable. Electronic recording device over left chest. IMPRESSION: No active cardiopulmonary disease. Electronically Signed   By: Donavan Foil M.D.   On: 08/19/2021 21:47   ? ?Procedures ?Procedures  ? ? ?Medications Ordered in ED ?Medications - No data to display ? ?ED Course/ Medical Decision Making/ A&P ?  ?                        ?Medical Decision Making ?Amount and/or Complexity of Data Reviewed ?Labs: ordered. ?Radiology: ordered. ? ? ?31 y.o. male presents to the ED with complaints of palpitations, this involves an extensive number of treatment options, and is a complaint that carries with it a high risk of complications and morbidity.  The differential diagnosis includes arrhythmia, including A-fib, SVT or sinus tachycardia, anxiety, electrolyte derangement  ? ?On arrival pt  is nontoxic, vitals here are significant for normal heart rate, and vitals are unremarkable. Exam without significant findings ? ?Additional history obtained from chart review. Previous records obtained and reviewed from patient's follow-up with Dr. Curt Bears with EP ? ? ? ?Lab Tests:  ?I Ordered, reviewed, and interpreted labs, which included: No leukocytosis and normal hemoglobin, no concern for acute anemia leading to tachycardia, glucose of 131 but no other electrolyte derangements and normal renal function, troponin negative ? ?Imaging Studies ordered:  ?  I ordered imaging studies which included chest x-ray, I independently visualized and interpreted imaging which showed no active cardiopulmonary disease ? ?ED Course:  ? ?Patient presents after an episode of heart racing sensation while at work, fortunately here in the ED patient has maintained a normal heart rate and is in normal sinus rhythm on EKG and cardiac monitor.  His lab work is overall been reassuring.  Patient has been evaluated multiple times previously for palpitations and has been followed by Dr. Curt Bears with the EP.  There was some concern that the EMTs saw a brief run of A-fib when they evaluated patient at work, I am unable to review those rhythm strips, but patient has a CHA2DS2-VASc score of 0 and so does not require anticoagulation in the setting of possible paroxysmal A-fib. ? ?Patient's heart rate increased mildly to 115 when up walking but maintained normal sinus rhythm.  At this time feel patient's work-up is reassuring and he is appropriate for outpatient follow-up I have asked him to follow-up closely with Dr. Curt Bears, he has a loop recorder in place but this has not been functional for the past 2 years and so was not available for interrogation. ? ?If he continues to experience palpitations may be a candidate for reactivating his loop recorder or Zio patch heart monitoring.  Stressed close follow-up and return precautions with patient  and he expresses understanding and agreement.  Discharged home in good condition. ? ? ?Portions of this note were generated with Lobbyist. Dictation errors may occur despite best attempts at proofreadi

## 2021-08-19 NOTE — Discharge Instructions (Signed)
Your lab work and EKG today are reassuring, your heart appears to be in a normal rhythm.  I am unable to see the EKG that EMS performed earlier tonight to know if this was truly A-fib, you should follow-up with your cardiologist for further evaluation.  Make sure you are staying well-hydrated and eating regular meals.  If you have persistent palpitations or racing heart rate again please return to the hospital for reevaluation. ?

## 2021-08-21 DIAGNOSIS — H539 Unspecified visual disturbance: Secondary | ICD-10-CM | POA: Insufficient documentation

## 2021-08-23 ENCOUNTER — Encounter: Payer: Self-pay | Admitting: Cardiology

## 2021-08-23 ENCOUNTER — Ambulatory Visit (INDEPENDENT_AMBULATORY_CARE_PROVIDER_SITE_OTHER): Payer: Commercial Managed Care - PPO | Admitting: Cardiology

## 2021-08-23 VITALS — BP 106/70 | HR 84 | Ht 66.0 in | Wt 210.0 lb

## 2021-08-23 DIAGNOSIS — R002 Palpitations: Secondary | ICD-10-CM

## 2021-08-23 NOTE — Patient Instructions (Signed)
?FDA-cleared personal EKG: The world?s most clinically validated personal EKG, FDA-cleared to detect Atrial Fibrillation, Bradycardia, and Tachycardia. Evalee Mutton is the most reliable way to check in on your heart from home. ?Take your EKG from anywhere: Capture a medical-grade EKG in 30 seconds and get an instant analysis right on your smartphone. Evalee Mutton is small enough to fit in your pocket, so you can take it with you anywhere. ?Easy to use: Simply place your fingers on the sensors--no wires, patches, or gels. ?Recommended by doctors: A trusted resource, Evalee Mutton is the #1 doctor-recommended personal EKG with more than 100 million EKGs recorded. ?Save or share your EKGs: With the press of a button, email your EKGs to your doctor or save them on your phone. ?Works with smartphones: Compatible with [employer] and tablets. Check our compatibility chart. ?FSA/HSA eligible: Purchase using an FSA or HSA account (please confirm coverage with your insurance provider). ?Phone clip included with purchase, a $15 value. Conveniently take your device with you wherever you go. ? ?https://store.BasicBling.tn ? ?Step One- Record your EKG strip on [facility] app.  ? ?Step two- On Kardia EKG click ?Download?  ? ?Step three- It will prompt you to make a password for this EKG. Please make the password ?[email or web address]? so that we can view it.  ? ?Step four- Click on the little ?upload? button (small box with an arrow in the middle) in the bottom left-hand corner of the screen.  ? ?Step five- Click ?Save to Files? ? ?Step six- Click on ?On my iphone? and then ?Pages? then press save in the top right-hand corner.  ? ?NOW GO TO MYCHART  ? ?Once on MyChart click ?Messages? ? ?Step one- Click ?Send a message? ? ?Step two- Click ?Ask a medical question?  ? ?Step three- Click ?Non urgent medical question?  ? ?Step four- Click on Rajan Revankar's name. ? ?Step five- Click on the small  paperclip at the bottom of the screen ? ?Step six- Click ?Choose file? ? ?Step seven- Pick the most recent EKG strip listed.  ? ?Once uploaded send the message! ? ?Medication Instructions:  ?Your physician recommends that you continue on your current medications as directed. Please refer to the Current Medication list given to you today.  ?*If you need a refill on your cardiac medications before your next appointment, please call your pharmacy* ? ? ?Lab Work: ?None ordered ?If you have labs (blood work) drawn today and your tests are completely normal, you will receive your results only by: ?MyChart Message (if you have MyChart) OR ?A paper copy in the mail ?If you have any lab test that is abnormal or we need to change your treatment, we will call you to review the results. ? ? ?Testing/Procedures: ?None ordered ? ? ?Follow-Up: ?At [facility], you and your health needs are our priority.  As part of our continuing mission to provide you with exceptional heart care, we have created designated Provider Care Teams.  These Care Teams include your primary Cardiologist (physician) and Advanced Practice Providers (APPs -  Physician Assistants and Nurse Practitioners) who all work together to provide you with the care you need, when you need it. ? ?We recommend signing up for the patient portal called "MyChart".  Sign up information is provided on this After Visit Summary.  MyChart is used to connect with patients for Virtual Visits (Telemedicine).  Patients are able to view lab/test results, encounter notes, upcoming appointments, etc.  Non-urgent messages can be sent  to your provider as well.   ?To learn more about what you can do with MyChart, go to [email or web address].   ? ?Your next appointment:   ?9 month(s) ? ?The format for your next appointment:   ?In Person ? ?Provider:   ?Jyl Heinz, MD ? ? ?Other Instructions ?NA ? ?

## 2021-08-23 NOTE — Progress Notes (Signed)
?Cardiology Office Note:   ? ?Date:  08/23/2021  ? ?ID:  Ian Lewis, DOB [birth year], MRN [id number] ? ?PCP:  Patient, No Pcp Per (Inactive)  ?Cardiologist:  Jenean Lindau, MD  ? ?Referring MD: No ref. provider found  ? ? ?ASSESSMENT:   ? ?1. Palpitations   ? ?PLAN:   ? ?In order of problems listed above: ? ?Palpitations: I discussed my findings with the patient at length and reassured him.  Emergency room records were reviewed.  I told him to obtain a cardia app to assess his rhythm since his palpitations happen so sporadically.  He also wants to see if the loop recorder can come out and to see if there were any tracings from last Sunday on the loop recorder.  We will contact our electrophysiology colleague and their staff to see if there is any possibility for this.  Questions were answered to his satisfaction.Patient will be seen in follow-up appointment in 6 months or earlier if the patient has any concerns ? ? ? ?Medication Adjustments/Labs and Tests Ordered: ?Current medicines are reviewed at length with the patient today.  Concerns regarding medicines are outlined above.  ?No orders of the defined types were placed in this encounter. ? ?No orders of the defined types were placed in this encounter. ? ? ? ?No chief complaint on file. ?  ? ?History of Present Illness:   ? ?Ian Lewis is a 31 y.o. male.  Patient has past medical history of palpitations and underwent loop recorder insertion many years ago.  Subsequently after 3 years he felt a sensation of palpitations on Sunday and was taken to the emergency room.  There is no record by EMT.  I am not clear as to what rhythm they found but the emergency rhythm was unremarkable.  Since then he is done fine.  No chest pain orthopnea PND or syncope.  At the time of my evaluation, the patient is alert awake oriented and in no distress. ? ?Past Medical History:  ?Diagnosis Date  ? Lower extremity weakness 12/17/2017  ? Palpitations 01/23/2018  ?  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 Formatting of this note might be different from the original. Added automatically from request for surgery [id number]  ? PSVT (paroxysmal supraventricular tachycardia) ([place]) 02/11/2019  ? Sensory loss 06/12/2018  ? Status post placement of implantable loop recorder 02/11/2019  ? SVT (supraventricular tachycardia) ([place])   ? Syncope   ? Vision abnormalities   ? Weakness of both legs 06/12/2018  ? ? ?Past Surgical History:  ?Procedure Laterality Date  ? KNEE SURGERY    ? LOOP RECORDER INSERTION N/A 02/05/2018  ? Procedure: LOOP RECORDER INSERTION;  Surgeon: Constance Haw, MD;  Location: [facility];  Service: Cardiovascular;  Laterality: N/A;  ? MANDIBLE FRACTURE SURGERY    ? ? ?Current Medications: ?Current Meds  ?Medication Sig  ? cetirizine (ZYRTEC ALLERGY) 10 MG tablet Take 1 tablet (10 mg total) by mouth daily.  ? fluticasone (FLONASE) 50 MCG/ACT nasal spray Place 1 spray into both nostrils daily.  ? ibuprofen (ADVIL,MOTRIN) 200 MG tablet Take 800 mg by mouth every 6 (six) hours as needed for fever or headache.  ?  ? ?Allergies:   Loracarbef  ? ?Social History  ? ?Socioeconomic History  ? Marital status: Single  ?  Spouse name: Not on file  ? Number of children: Not on file  ? Years of education: Not on file  ? Highest education level: Not  on file  ?Occupational History  ? Not on file  ?Tobacco Use  ? Smoking status: Never  ? Smokeless tobacco: Never  ?Substance and Sexual Activity  ? Alcohol use: No  ? Drug use: No  ? Sexual activity: Not on file  ?Other Topics Concern  ? Not on file  ?Social History Narrative  ? Not on file  ? ?Social Determinants of Health  ? ?Financial Resource Strain: Not on file  ?Food Insecurity: Not on file  ?Transportation Needs: Not on file  ?Physical Activity: Not on file  ?Stress: Not on file  ?Social Connections: Not on file  ?  ? ?Family History: ?The patient's family history includes Atrial fibrillation in his father. ? ?ROS:    ?Please see the history of present illness.    ?All other systems reviewed and are negative. ? ?EKGs/Labs/Other Studies Reviewed:   ? ?The following studies were reviewed today: ?I discussed my findings with the patient at length.  EKG reveals sinus rhythm at the emergency room. ? ? ?Recent Labs: ?07/21/2021: ALT 14 ?08/19/2021: BUN 9; Creatinine, Ser 0.90; Hemoglobin 16.2; Platelets 265; Potassium 3.9; Sodium 138  ?Recent Lipid Panel ?No results found for: CHOL, TRIG, HDL, CHOLHDL, VLDL, LDLCALC, LDLDIRECT ? ?Physical Exam:   ? ?VS:  BP 106/70   Pulse 84   Ht '5\' 6"'$  (1.676 m)   Wt 210 lb 0.6 oz (95.3 kg)   SpO2 98%   BMI 33.90 kg/m?    ? ?Wt Readings from Last 3 Encounters:  ?08/23/21 210 lb 0.6 oz (95.3 kg)  ?08/19/21 205 lb 0.4 oz (93 kg)  ?07/21/21 205 lb (93 kg)  ?  ? ?GEN: Patient is in no acute distress ?HEENT: Normal ?NECK: No JVD; No carotid bruits ?LYMPHATICS: No lymphadenopathy ?CARDIAC: Hear sounds regular, 2/6 systolic murmur at the apex. ?RESPIRATORY:  Clear to auscultation without rales, wheezing or rhonchi  ?ABDOMEN: Soft, non-tender, non-distended ?MUSCULOSKELETAL:  No edema; No deformity  ?SKIN: Warm and dry ?NEUROLOGIC:  Alert and oriented x 3 ?PSYCHIATRIC:  Normal affect  ? ?Signed, ?Jenean Lindau, MD  ?08/23/2021 1:06 PM    ?[place]  ?

## 2021-12-12 ENCOUNTER — Encounter (HOSPITAL_COMMUNITY): Payer: Self-pay

## 2021-12-12 ENCOUNTER — Ambulatory Visit (HOSPITAL_COMMUNITY)
Admission: EM | Admit: 2021-12-12 | Discharge: 2021-12-12 | Disposition: A | Discharge status: Home / Self Care | Payer: 59 | Attending: Sports Medicine | Admitting: Sports Medicine

## 2021-12-12 DIAGNOSIS — S39012A Strain of muscle, fascia and tendon of lower back, initial encounter: Secondary | ICD-10-CM

## 2021-12-12 MED ORDER — CYCLOBENZAPRINE HCL 10 MG PO TABS: 10.0000 mg | ORAL_TABLET | Freq: Two times a day (BID) | ORAL | 0 refills | Status: DC | PRN

## 2021-12-12 NOTE — ED Triage Notes (Signed)
Pt states his lt knee gave out while going down his steps and fell down 4 steps hitting his back. States took ibuprofen and using a heating pad with no relief.

## 2021-12-12 NOTE — Discharge Instructions (Addendum)
You have a low back (lumbar) muscle strain  Your pain is most likely caused by irritation to the muscles or ligaments.   You may use heating pad in 15 minute intervals as needed for additional comfort, within the first 2-3 days you may find comfort in using ice in 10-15 minutes over affected area  Begin stretching affected area daily for 10 minutes as tolerated to further loosen muscles   When lying down place pillow underneath and between knees for support  Can try sleeping without pillow on firm mattress   Practice good posture: head back, shoulders back, chest forward, pelvis back and weight distributed evenly on both legs  If pain persist after recommended treatment or reoccurs if may be beneficial to follow up with orthopedic specialist for evaluation, this doctor specializes in the bones and can manage your symptoms long-term with options such as but not limited to imaging, medications or physical therapy

## 2021-12-12 NOTE — ED Provider Notes (Signed)
[facility]   [id number] 12/12/21 Arrival Time: 6283  ASSESSMENT & PLAN:  1. Strain of lumbar region, initial encounter   Patient's history and exam are consistent with a strain of the lumbar paraspinal muscles Able to ambulate here and hemodynamically stable. No indication for imaging of back at this time given lack of bony tenderness normal neurological exam. Discussed.  Meds ordered this encounter  Medications   cyclobenzaprine (FLEXERIL) 10 MG tablet    Sig: Take 1 tablet (10 mg total) by mouth 2 (two) times daily as needed for muscle spasms.    Dispense:  20 tablet    Refill:  0    Medication sedation precautions given. Encourage ROM/movement as tolerated.  Recommend: Cyclobenzaprine as needed Ibuprofen 800 3 times daily Heat, ice, stretching  Reviewed expectations re: course of current medical issues. Questions answered. Outlined signs and symptoms indicating need for more acute intervention. Patient verbalized understanding. After Visit Summary given.   SUBJECTIVE: History from: patient.  Ian Lewis is a 31 y.o. male who presents with complaint of acute bilateral lower back discomfort. Onset abrupt. First noted yesterday. Injury/trama: yes, L knee gave out and fell onto back going down stairs. History of back problems requiring medical care: no. Pain described as soreness and without radiation. Aggravating factors: certain movements. Alleviating factors:  resting . Progressive LE weakness or saddle anesthesia: none. Extremity sensation changes or weakness: none. Ambulatory without difficulty. Normal bowel/bladder habits: yes; without urinary retention. Normal PO intake without n/v. No associated abdominal pain/n/v. Self treatment: has NSAID, with minimal relief.  Reports no chronic steroid use, fevers, IV drug use, or recent back surgeries or procedures.  ROS: As per HPI. All other systems negative.   OBJECTIVE:  Vitals:   12/12/21 1054  BP:  122/74  Pulse: 73  Resp: 18  Temp: (!) 97.5 F (36.4 C)  TempSrc: Oral  SpO2: 99%    General appearance: alert; no distress HEENT: [place]; AT Neck: supple with FROM; without midline tenderness CV: regular Lungs: unlabored respirations; speaks full sentences without difficulty Abdomen: soft, non-tender; non-distended Back: well localized tenderness to palpation over bilateral lumbar paraspinal muscles ; FROM at waist; bruising: none; without midline spinous process tenderness Extremities: without edema; symmetrical without gross deformities; normal ROM of all extremities Skin: warm and dry Neurologic: normal gait; normal sensation and strength of bilateral LE Psychological: alert and cooperative; normal mood and affect  Labs: Results for orders placed or performed during the hospital encounter of 15/17/61  Basic metabolic panel  Result Value Ref Range   Sodium 138 135 - 145 mmol/L   Potassium 3.9 3.5 - 5.1 mmol/L   Chloride 107 98 - 111 mmol/L   CO2 25 22 - 32 mmol/L   Glucose, Bld 131 (H) 70 - 99 mg/dL   BUN 9 6 - 20 mg/dL   Creatinine, Ser 0.90 0.61 - 1.24 mg/dL   Calcium 9.0 8.9 - 10.3 mg/dL   GFR, Estimated >60 >60 mL/min   Anion gap 6 5 - 15  CBC  Result Value Ref Range   WBC 9.3 4.0 - 10.5 K/uL   RBC 5.57 4.22 - 5.81 MIL/uL   Hemoglobin 16.2 13.0 - 17.0 g/dL   HCT 47.2 39.0 - 52.0 %   MCV 84.7 80.0 - 100.0 fL   MCH 29.1 26.0 - 34.0 pg   MCHC 34.3 30.0 - 36.0 g/dL   RDW 12.3 11.5 - 15.5 %   Platelets 265 150 - 400 K/uL  nRBC 0.0 0.0 - 0.2 %  Troponin I (High Sensitivity)  Result Value Ref Range   Troponin I (High Sensitivity) 3 <18 ng/L   Labs Reviewed - No data to display  Imaging: No results found.  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 Added automatically from request for surgery [id number]   PSVT (paroxysmal supraventricular tachycardia) ([place]) 02/11/2019   Sensory loss 06/12/2018   Status post placement of implantable loop recorder 02/11/2019   SVT (supraventricular tachycardia) ([facility])    Syncope    Vision abnormalities    Weakness of both legs 06/12/2018   Social History   Socioeconomic History   Marital status: Single    Spouse name: Not on file   Number of children: Not on file   Years of education: Not on file   Highest education level: Not on file  Occupational History   Not on file  Tobacco Use   Smoking status: Never   Smokeless tobacco: Never  Substance and Sexual Activity   Alcohol use: No   Drug use: No   Sexual activity: Not on file  Other Topics Concern   Not on file  Social History Narrative   Not on file   Social Determinants of Health   Financial Resource Strain: Not on file  Food Insecurity: Not on file  Transportation Needs: Not on file  Physical Activity: Not on file  Stress: Not on file  Social Connections: Not on file  Intimate Partner Violence: Not on file   Family History  Problem Relation Age of Onset   Atrial fibrillation Father    Past Surgical History:  Procedure Laterality Date   KNEE SURGERY     LOOP RECORDER INSERTION N/A 02/05/2018   Procedure: LOOP RECORDER INSERTION;  Surgeon: Constance Haw, MD;  Location: [facility];  Service: Cardiovascular;  Laterality: N/A;   MANDIBLE FRACTURE SURGERY        Ian Lewis, Dorian Pod, MD 12/12/21 1144

## 2022-02-22 ENCOUNTER — Emergency Department (HOSPITAL_COMMUNITY)
Admission: EM | Admit: 2022-02-22 | Discharge: 2022-02-22 | Discharge status: Against Medical Advice | Payer: 59 | Attending: Emergency Medicine | Admitting: Emergency Medicine

## 2022-02-22 ENCOUNTER — Telehealth: Payer: Self-pay | Admitting: Cardiology

## 2022-02-22 ENCOUNTER — Encounter (HOSPITAL_COMMUNITY): Payer: Self-pay | Admitting: *Deleted

## 2022-02-22 ENCOUNTER — Emergency Department (HOSPITAL_COMMUNITY): Payer: 59

## 2022-02-22 ENCOUNTER — Other Ambulatory Visit: Payer: Self-pay

## 2022-02-22 ENCOUNTER — Encounter: Payer: Self-pay | Admitting: Cardiology

## 2022-02-22 DIAGNOSIS — R42 Dizziness and giddiness: Secondary | ICD-10-CM | POA: Insufficient documentation

## 2022-02-22 DIAGNOSIS — R079 Chest pain, unspecified: Secondary | ICD-10-CM | POA: Insufficient documentation

## 2022-02-22 DIAGNOSIS — Z5321 Procedure and treatment not carried out due to patient leaving prior to being seen by health care provider: Secondary | ICD-10-CM | POA: Insufficient documentation

## 2022-02-22 DIAGNOSIS — R002 Palpitations: Secondary | ICD-10-CM | POA: Insufficient documentation

## 2022-02-22 LAB — CBC WITH DIFFERENTIAL/PLATELET
Abs Immature Granulocytes: 0.03 10*3/uL (ref 0.00–0.07)
Basophils Absolute: 0.1 10*3/uL (ref 0.0–0.1)
Basophils Relative: 1 %
Eosinophils Absolute: 0.1 10*3/uL (ref 0.0–0.5)
Eosinophils Relative: 2 %
HCT: 47.5 % (ref 39.0–52.0)
Hemoglobin: 16.6 g/dL (ref 13.0–17.0)
Immature Granulocytes: 0 %
Lymphocytes Relative: 26 %
Lymphs Abs: 2.4 10*3/uL (ref 0.7–4.0)
MCH: 29.6 pg (ref 26.0–34.0)
MCHC: 34.9 g/dL (ref 30.0–36.0)
MCV: 84.8 fL (ref 80.0–100.0)
Monocytes Absolute: 0.6 10*3/uL (ref 0.1–1.0)
Monocytes Relative: 7 %
Neutro Abs: 6 10*3/uL (ref 1.7–7.7)
Neutrophils Relative %: 64 %
Platelets: 304 10*3/uL (ref 150–400)
RBC: 5.6 MIL/uL (ref 4.22–5.81)
RDW: 12.6 % (ref 11.5–15.5)
WBC: 9.2 10*3/uL (ref 4.0–10.5)
nRBC: 0 % (ref 0.0–0.2)

## 2022-02-22 LAB — BASIC METABOLIC PANEL
Anion gap: 10 (ref 5–15)
BUN: 13 mg/dL (ref 6–20)
CO2: 23 mmol/L (ref 22–32)
Calcium: 9 mg/dL (ref 8.9–10.3)
Chloride: 104 mmol/L (ref 98–111)
Creatinine, Ser: 1 mg/dL (ref 0.61–1.24)
GFR, Estimated: >60 mL/min (ref >60)
Glucose, Bld: 100 mg/dL — ABNORMAL HIGH (ref 70–99)
Potassium: 3.8 mmol/L (ref 3.5–5.1)
Sodium: 137 mmol/L (ref 135–145)

## 2022-02-22 LAB — MAGNESIUM: Magnesium: 2.1 mg/dL (ref 1.7–2.4)

## 2022-02-22 LAB — TROPONIN I (HIGH SENSITIVITY): Troponin I (High Sensitivity): <2 ng/L (ref <18)

## 2022-02-22 NOTE — Telephone Encounter (Signed)
Patient c/o Palpitations:  High priority if patient c/o lightheadedness, shortness of breath, or chest pain  How long have you had palpitations/irregular HR/ Afib? Are you having the symptoms now?  Palpitations on and off over the past 24 hours, mainly when standing No major palpitations currently  Are you currently experiencing lightheadedness, SOB or CP?  No   Do you have a history of afib (atrial fibrillation) or irregular heart rhythm?  Hx of SVT's   Have you checked your BP or HR? (document readings if available):  10/13: 119, 111 (both standing) 10/12: 70-89 (while laying down)  Are you experiencing any other symptoms?  Mild SOB, not currently

## 2022-02-22 NOTE — Telephone Encounter (Signed)
Called patient and he reported that for the past 24 hours he had been having heavy palpitations, he was also light headed and dizzy and shortness of breath. Currently while he was sitting down his heart rate was ranging from 110-120. 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 Back in May he was dizzy and he passed out for about 3-4 minutes. I spoke to Dr. Bettina Gavia regarding this patients symptoms and he recommended that the patient go to the ER to be evaluated. I relayed Dr. Joya Gaskins recommendation to the patient and he was agreeable with this plan and stated that he would go to the ER. Patient had no further questions at this time.

## 2022-02-22 NOTE — Telephone Encounter (Signed)
Error

## 2022-02-22 NOTE — ED Notes (Signed)
Pt stated that he will make an apt with his PCP. Pt was seen leaving the ED.

## 2022-02-22 NOTE — ED Provider Triage Note (Signed)
Emergency Medicine Provider Triage Evaluation Note  JARRYD GRATZ , a 31 y.o. male  was evaluated in triage.  Pt complains of heart palpitations going on today, feels as if his heart is skipping a beat, will get lightheaded and dizziness, last couple seconds and resolved, has history of SVT, currently is having some chest pain at this time, denies shortness of breath or pleuritic chest pain denies alcohol use illicit drug use, denies other complaints..  Review of Systems  Positive: Heart palpitations, chest pain Negative: Nausea vomiting  Physical Exam  BP 131/78 (BP Location: Left Arm)   Pulse 83   Temp 99 F (37.2 C) (Oral)   Resp 16   Ht '5\' 6"'$  (1.676 m)   Wt 95.3 kg   SpO2 98%   BMI 33.91 kg/m  Gen:   Awake, no distress   Resp:  Normal effort  MSK:   Moves extremities without difficulty  Other:    Medical Decision Making  Medically screening exam initiated at 5:58 PM.  Appropriate orders placed.  JOSTIN RUE was informed that the remainder of the evaluation will be completed by another provider, this initial triage assessment does not replace that evaluation, and the importance of remaining in the ED until their evaluation is complete.  Lab work imaging ordered will need further work-up.   Marcello Fennel, PA-C 02/22/22 1759

## 2022-02-22 NOTE — ED Triage Notes (Signed)
The pt is c/o  of dizziness for 24 hours he has a history of the same cramp like pain around his lower heart area.

## 2022-02-22 NOTE — Telephone Encounter (Signed)
Patient states he is returning a call.

## 2022-02-24 ENCOUNTER — Encounter (HOSPITAL_COMMUNITY): Payer: Self-pay

## 2022-02-24 ENCOUNTER — Ambulatory Visit (HOSPITAL_COMMUNITY)
Admission: EM | Admit: 2022-02-24 | Discharge: 2022-02-24 | Disposition: A | Discharge status: Home / Self Care | Payer: 59 | Attending: Physician Assistant | Admitting: Physician Assistant

## 2022-02-24 DIAGNOSIS — I471 Supraventricular tachycardia, unspecified: Secondary | ICD-10-CM | POA: Diagnosis not present

## 2022-02-24 MED ORDER — PROPRANOLOL HCL ER 60 MG PO CP24: 60.0000 mg | ORAL_CAPSULE | Freq: Every day | ORAL | 0 refills | Status: DC

## 2022-02-24 NOTE — ED Triage Notes (Signed)
The pt c/o dizziness and sob for 24 hours. Pt reports a history of SVT.

## 2022-02-24 NOTE — Discharge Instructions (Addendum)
Advised to call your cardiologist to arrange an appointment to be seen tomorrow or Tuesday at the latest for evaluation of the SVT. Advised to relax and take it easy and avoid strenuous activity until you speak with the cardiologist. Advised to start Inderal LA 60 mg once daily to help prevent SVT from occurring. Vies to follow-up with PCP return to urgent care as needed.

## 2022-02-24 NOTE — ED Provider Notes (Signed)
[facility]    CSN: [id number] Arrival date & time: 02/24/22  1338      History   Chief Complaint No chief complaint on file.   HPI Ian Lewis is a 31 y.o. male.   31 year old male presents with palpitations.  Patient indicates that he has a history of having SVT T.  He indicates he does have a cardiologist that he sees on a regular basis.  Patient indicates that the SVT was not a problem until 4 days ago.  He indicates that he works 2 separate jobs, both of these is where he stands on his feet for the duration of his work shift.  Patient indicates that 4 days ago he started having palpitations, shortness of breath, mild dizziness, to where the symptoms would last for a couple minutes and then resolve.  He indicates that the symptoms are worse when he would do strenuous activity but then when he would relax and rest the symptoms would resolve.  Indicates that he has had the SVT symptoms occur intermittently over the past 4 days and has become progressively worse when he is at work doing his job duties.  He indicates he has not had any chest pain with the symptoms but he has had some chest pressure: He describes it more of a cramp when his symptoms occur.  He indicates he has had some nausea but has not thrown up with the symptoms.  Patient indicates that he has taken off work today and he is off tomorrow, he has sent him message to his cardiologist to see if he can get in to be evaluated on Monday. Patient does request medication to help prevent the SVTs from occurring while he is waiting to get into the cardiologist.     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   PSVT (paroxysmal supraventricular tachycardia) 02/11/2019   Status post placement of implantable loop recorder 02/11/2019   Weakness of both legs 06/12/2018   Sensory loss 06/12/2018   Palpitations 01/23/2018   Syncope 01/23/2018   Lower extremity weakness 12/17/2017    Past Surgical History:  Procedure Laterality Date   KNEE SURGERY     LOOP RECORDER INSERTION N/A 02/05/2018   Procedure: LOOP RECORDER INSERTION;  Surgeon: Constance Haw, MD;  Location: [facility];  Service: Cardiovascular;  Laterality: N/A;   MANDIBLE FRACTURE SURGERY         Home Medications    Prior to Admission medications   Medication Sig Start Date End Date Taking? Authorizing Provider  propranolol ER (INDERAL LA) 60 MG 24 hr capsule Take 1 capsule (60 mg total) by mouth daily. To help prevent SVT. 02/24/22  Yes Nyoka Lint, PA-C  cetirizine (ZYRTEC ALLERGY) 10 MG tablet Take 1 tablet (10 mg total) by mouth daily. 05/12/20   Hall-Potvin, [place], PA-C  cyclobenzaprine (FLEXERIL) 10 MG tablet Take 1 tablet (10 mg  total) by mouth 2 (two) times daily as needed for muscle spasms. 12/12/21   Rafoth, Dorian Pod, MD  fluticasone (FLONASE) 50 MCG/ACT nasal spray Place 1 spray into both nostrils daily. 05/12/20   Hall-Potvin, [place], PA-C  ibuprofen (ADVIL,MOTRIN) 200 MG tablet Take 800 mg by mouth every 6 (six) hours as needed for fever or headache.    [provider]    Family History Family History  Problem Relation Age of Onset   Atrial fibrillation Father     Social History Social History   Tobacco Use   Smoking status: Never   Smokeless tobacco: Never  Substance Use Topics    Alcohol use: No   Drug use: No     Allergies   Loracarbef   Review of Systems Review of Systems  Respiratory:  Positive for chest tightness (palpitations) and shortness of breath.      Physical Exam Triage Vital Signs ED Triage Vitals  Enc Vitals Group     BP 02/24/22 1345 135/89     Pulse Rate 02/24/22 1347 100     Resp 02/24/22 1345 18     Temp 02/24/22 1345 98.5 F (36.9 C)     Temp Source 02/24/22 1345 Oral     SpO2 02/24/22 1345 95 %     Weight --      Height --      Head Circumference --      Peak Flow --      Pain Score --      Pain Loc --      Pain Edu? --      Excl. in [place]? --    No data found.  Updated Vital Signs BP 135/89 (BP Location: Left Arm)   Pulse 100   Temp 98.5 F (36.9 C) (Oral)   Resp 18   SpO2 95%   Visual Acuity Right Eye Distance:   Left Eye Distance:   Bilateral Distance:    Right Eye Near:   Left Eye Near:    Bilateral Near:     Physical Exam Constitutional:      Appearance: Normal appearance.  Cardiovascular:     Rate and Rhythm: Normal rate and regular rhythm.     Heart sounds: Normal heart sounds.  Pulmonary:     Effort: Pulmonary effort is normal.     Breath sounds: Normal breath sounds and air entry. No wheezing, rhonchi or rales.  Neurological:     Mental Status: He is alert.      UC Treatments / Results  Labs (all labs ordered are listed, but only abnormal results are displayed) Labs Reviewed - No data to display  EKG: Normal sinus rhythm.   Radiology DG Chest 2 View  Result Date: 02/22/2022 CLINICAL DATA:  Chest pain. Heart palpitations and inferior left chest pain for 24 hours. EXAM: CHEST - 2 VIEW COMPARISON:  08/19/2021 FINDINGS: Normal heart size and pulmonary vascularity. No focal airspace disease or consolidation in the lungs. No blunting of costophrenic angles. No pneumothorax. Mediastinal contours appear intact. A loop recorder is present. IMPRESSION: No active cardiopulmonary disease.  Electronically Signed   By: Lucienne Capers M.D.   On: 02/22/2022 18:47    Procedures Procedures (including critical care time)  Medications Ordered in UC Medications - No data to display  Initial Impression / Assessment and Plan / UC Course  I have reviewed the triage vital signs and the nursing notes.  Pertinent labs & imaging results that were available during my  care of the patient were reviewed by me and considered in my medical decision making (see chart for details).    Plan: 1.  The SVT will be treated with the following: A.  Patient advised to contact cardiology Monday morning to see if he can get an appointment as a walk-in to be evaluated. B.  Patient given the option of starting Inderal LA 60 mg daily to help prevent the SVT or to wait before starting until he talks with the cardiologist to see that plan of treatment. C.  Patient advised to avoid any strenuous activity until after he is evaluated by the cardiologist. D.  The SVT symptoms warranted an EKG to be performed: Normal sinus rhythm without abnormality. 2.  Advised to follow-up with cardiology as soon as she can get an appointment Monday morning. 3.  Return to urgent care as needed. Final Clinical Impressions(s) / UC Diagnoses   Final diagnoses:  SVT (supraventricular tachycardia)     Discharge Instructions      Advised to call your cardiologist to arrange an appointment to be seen tomorrow or Tuesday at the latest for evaluation of the SVT. Advised to relax and take it easy and avoid strenuous activity until you speak with the cardiologist. Advised to start Inderal LA 60 mg once daily to help prevent SVT from occurring. Vies to follow-up with PCP return to urgent care as needed.    ED Prescriptions     Medication Sig Dispense Auth. Provider   propranolol ER (INDERAL LA) 60 MG 24 hr capsule Take 1 capsule (60 mg total) by mouth daily. To help prevent SVT. 30 capsule Nyoka Lint, PA-C      PDMP not  reviewed this encounter.   Nyoka Lint, PA-C 02/24/22 1500

## 2022-02-26 ENCOUNTER — Ambulatory Visit: Payer: 59 | Attending: Cardiovascular Disease | Admitting: Cardiovascular Disease

## 2022-02-26 ENCOUNTER — Ambulatory Visit: Payer: 59 | Attending: Cardiovascular Disease

## 2022-02-26 ENCOUNTER — Encounter: Payer: Self-pay | Admitting: Cardiovascular Disease

## 2022-02-26 VITALS — BP 128/68 | HR 87 | Ht 66.0 in | Wt 222.0 lb

## 2022-02-26 DIAGNOSIS — I471 Supraventricular tachycardia, unspecified: Secondary | ICD-10-CM | POA: Diagnosis not present

## 2022-02-26 DIAGNOSIS — R002 Palpitations: Secondary | ICD-10-CM

## 2022-02-26 DIAGNOSIS — R55 Syncope and collapse: Secondary | ICD-10-CM

## 2022-02-26 NOTE — Patient Instructions (Addendum)
Medication Instructions:  Your physician recommends that you continue on your current medications as directed. Please refer to the Current Medication list given to you today.  *If you need a refill on your cardiac medications before your next appointment, please call your pharmacy*  Testing/Procedures: Your physician has recommended that you wear an event monitor. Event monitors are medical devices that record the heart's electrical activity. Doctors most often [place] these monitors to diagnose arrhythmias. Arrhythmias are problems with the speed or rhythm of the heartbeat. The monitor is a small, portable device. You can wear one while you do your normal daily activities. This is usually used to diagnose what is causing palpitations/syncope (passing out).  Follow-Up: At [facility], you and your health needs are our priority.  As part of our continuing mission to provide you with exceptional heart care, we have created designated Provider Care Teams.  These Care Teams include your primary Cardiologist (physician) and Advanced Practice Providers (APPs -  Physician Assistants and Nurse Practitioners) who all work together to provide you with the care you need, when you need it.   Your next appointment:   6 week(s)  The format for your next appointment:   In Person  Provider:   Doralee Albino, MD  [facility] Instructions  Your physician has requested you wear a ZIO patch monitor for 14 days.  This is a single patch monitor. [employer] supplies one patch monitor per enrollment. Additional stickers are not available. Please do not apply patch if you will be having a Nuclear Stress Test,  Echocardiogram, Cardiac CT, MRI, or Chest Xray during the period you would be wearing the  monitor. The patch cannot be worn during these tests. You cannot remove and re-apply the  ZIO XT patch monitor.  Your ZIO patch monitor will be mailed 3 day USPS to your address on file. It may take  3-5 days  to receive your monitor after you have been enrolled.  Once you have received your monitor, please review the enclosed instructions. Your monitor  has already been registered assigning a specific monitor serial # to you.  Billing and Patient Assistance Program Information  We have supplied [employer] with any of your insurance information on file for billing purposes. [employer] offers a sliding scale Patient Assistance Program for patients that do not have  insurance, or whose insurance does not completely cover the cost of the ZIO monitor.  You must apply for the Patient Assistance Program to qualify for this discounted rate.  To apply, please call [employer] at [phone], select option 4, select option 2, ask to apply for  Patient Assistance Program. Theodore Demark will ask your household income, and how many people  are in your household. They will quote your out-of-pocket cost based on that information.  [employer] will also be able to set up a 76-month interest-free payment plan if needed.  Applying the monitor   Shave hair from upper left chest.  Hold abrader disc by orange tab. Rub abrader in 40 strokes over the upper left chest as  indicated in your monitor instructions.  Clean area with 4 enclosed alcohol pads. Let dry.  Apply patch as indicated in monitor instructions. Patch will be placed under collarbone on left  side of chest with arrow pointing upward.  Rub patch adhesive wings for 2 minutes. Remove white label marked "1". Remove the white  label marked "2". Rub patch adhesive wings for 2 additional minutes.  While looking in a mirror,  press and release button in center of patch. A small green light will  flash 3-4 times. This will be your only indicator that the monitor has been turned on.  Do not shower for the first 24 hours. You may shower after the first 24 hours.  Press the button if you feel a symptom. You will hear a small click. Record Date, Time and  Symptom in the  Patient Logbook.  When you are ready to remove the patch, follow instructions on the last 2 pages of Patient  Logbook. Stick patch monitor onto the last page of Patient Logbook.  Place Patient Logbook in the blue and white box. Use locking tab on box and tape box closed  securely. The blue and white box has prepaid postage on it. Please place it in the mailbox as  soon as possible. Your physician should have your test results approximately 7 days after the  monitor has been mailed back to [facility].  Call [facility] at [phone] if you have questions regarding  your ZIO XT patch monitor. Call them immediately if you see an orange light blinking on your  monitor.  If your monitor falls off in less than 4 days, contact our Monitor department at [phone].  If your monitor becomes loose or falls off after 4 days call [employer] at [phone] for  suggestions on securing your monitor

## 2022-02-26 NOTE — Progress Notes (Unsigned)
Enrolled for [employer] to mail a ZIO XT long term holter monitor to the patients address on file.  

## 2022-02-26 NOTE — Progress Notes (Signed)
Cardiology Office Note:    Date:  02/26/2022   ID:  Ian Lewis, DOB 30-Dec-1990, MRN [id number]  PCP:  Patient, No Pcp Per   [facility] Providers Cardiologist:  None Electrophysiologist:  Will Meredith Leeds, MD     Referring MD: Nyoka Lint, PA-C   Chief complaint: plalpitations  History of Present Illness:    Ian Lewis is a 31 y.o. male with a hx of syncope and SVT referred for arrhythmia management.  He has a history of palpitations and rapid heart rates for which he has previously seen my colleague, Dr. Curt Bears. A loop recorder was placed in September of 2019. Our last transmission was received in September, 2020. To-date, there has been no documentation of any arrhythmia.  He was seen by Dr. Geraldo Pitter in April of 2023 after he had a recurrence of syncope associated with palpitations resulting in an ER visit. The strips from the ER show normal rhythm. Per the patient, the EMS squad mentioned that he was in atrial fibrillation.  He had an episode of palpitations and rapid heart rates while at work last week and went to urgent care. He reports that he was told he had SVT at that visit and was started on propranolol. He has been feeling somewhat better on propranolol though it wears off midway through the day.     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 Added automatically from request for surgery [id number]   PSVT (paroxysmal supraventricular tachycardia) 02/11/2019   Sensory loss 06/12/2018   Status post placement of implantable loop recorder 02/11/2019   SVT (supraventricular tachycardia)    Syncope    Vision abnormalities    Weakness of both legs 06/12/2018    Past Surgical History:  Procedure Laterality Date   KNEE SURGERY     LOOP RECORDER INSERTION N/A 02/05/2018   Procedure:  LOOP RECORDER INSERTION;  Surgeon: Constance Haw, MD;  Location: [facility];  Service: Cardiovascular;  Laterality: N/A;   MANDIBLE FRACTURE SURGERY      Current Medications: No outpatient medications have been marked as taking for the 02/26/22 encounter (Appointment) with Fenton Candee, Yetta Barre, MD.     Allergies:   Loracarbef   Social History   Socioeconomic History   Marital status: Single    Spouse name: Not on file   Number of children: Not on file   Years of education: Not on file   Highest education level: Not on file  Occupational History   Not on file  Tobacco Use   Smoking status: Never   Smokeless tobacco: Never  Substance and Sexual Activity   Alcohol use: No   Drug use: No   Sexual activity: Not on file  Other Topics Concern   Not on file  Social History Narrative   Not on file   Social Determinants of Health   Financial Resource Strain: Not on file  Food Insecurity: Not on file  Transportation Needs: Not on file  Physical Activity: Not on file  Stress: Not on file  Social Connections: Not on file     Family History: The patient's family history includes Atrial fibrillation in his father.  ROS:   Please see the history of present illness.    All other systems reviewed and are negative.  EKGs/Labs/Other Studies Reviewed:  TTE 03/03/2018 Heart is structurally and functionally normal  EKG:  Last EKG results: today --  sinus rhythm 89 bpm   Recent Labs: 07/21/2021: ALT 14 02/22/2022: BUN 13; Creatinine, Ser 1.00; Hemoglobin 16.6; Magnesium 2.1; Platelets 304; Potassium 3.8; Sodium 137      Physical Exam:    VS:  There were no vitals taken for this visit.    Wt Readings from Last 3 Encounters:  02/22/22 210 lb 1.6 oz (95.3 kg)  08/23/21 210 lb 0.6 oz (95.3 kg)  08/19/21 205 lb 0.4 oz (93 kg)     GEN: Well nourished, well developed in no acute distress CARDIAC: RRR, no murmurs, rubs, gallops RESPIRATORY:  Normal work of  breathing MUSCULOSKELETAL: no edema    ASSESSMENT & PLAN:    Paroxysmal SVT, palpitations Syncope  Plan for both items: we will place a 14 day monitor. If this is unrevealing, we will plan to proceed with removal and replacement of his ILR.        Medication Adjustments/Labs and Tests Ordered: Current medicines are reviewed at length with the patient today.  Concerns regarding medicines are outlined above.  No orders of the defined types were placed in this encounter.  No orders of the defined types were placed in this encounter.    Signed, Melida Quitter, MD  02/26/2022 7:58 AM    [place]

## 2022-03-01 DIAGNOSIS — R002 Palpitations: Secondary | ICD-10-CM

## 2022-03-01 DIAGNOSIS — R55 Syncope and collapse: Secondary | ICD-10-CM | POA: Diagnosis not present

## 2022-03-01 DIAGNOSIS — I471 Supraventricular tachycardia, unspecified: Secondary | ICD-10-CM | POA: Diagnosis not present

## 2022-03-28 DIAGNOSIS — R002 Palpitations: Secondary | ICD-10-CM | POA: Diagnosis not present

## 2022-03-29 ENCOUNTER — Encounter (HOSPITAL_COMMUNITY): Payer: Self-pay

## 2022-03-29 ENCOUNTER — Ambulatory Visit (HOSPITAL_COMMUNITY)
Admission: EM | Admit: 2022-03-29 | Discharge: 2022-03-29 | Disposition: A | Discharge status: Home / Self Care | Payer: 59 | Attending: Family Medicine | Admitting: Family Medicine

## 2022-03-29 ENCOUNTER — Ambulatory Visit (INDEPENDENT_AMBULATORY_CARE_PROVIDER_SITE_OTHER): Payer: 59

## 2022-03-29 DIAGNOSIS — M79671 Pain in right foot: Secondary | ICD-10-CM

## 2022-03-29 MED ORDER — NAPROXEN 500 MG PO TABS: 500.0000 mg | ORAL_TABLET | Freq: Two times a day (BID) | ORAL | 0 refills | Status: DC | PRN

## 2022-03-29 NOTE — ED Provider Notes (Signed)
[place]    CSN: [id number] Arrival date & time: 03/29/22  1340      History   Chief Complaint Chief Complaint  Patient presents with   Foot Pain    HPI Ian Lewis is a 31 y.o. male.    Foot Pain   Here for pain in his right foot.  About 3 days ago he initially had some mild aching in his right ankle.  Now that is not hurting at all but he has pain in the arch of the right foot and the medial aspect of the right foot.  It bothers him most when he is bearing weight and walking.  He describes the pain as sharp.  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   PSVT (paroxysmal supraventricular tachycardia) 02/11/2019   Status post placement of implantable loop recorder 02/11/2019   Weakness of both legs 06/12/2018   Sensory loss 06/12/2018   Palpitations 01/23/2018   Syncope 01/23/2018   Lower extremity weakness 12/17/2017    Past Surgical History:  Procedure Laterality Date   KNEE SURGERY     LOOP RECORDER INSERTION N/A 02/05/2018   Procedure: LOOP RECORDER INSERTION;  Surgeon: Constance Haw, MD;  Location: [facility];  Service: Cardiovascular;  Laterality: N/A;   MANDIBLE FRACTURE SURGERY         Home Medications    Prior to Admission medications    Medication Sig Start Date End Date Taking? Authorizing Provider  naproxen (NAPROSYN) 500 MG tablet Take 1 tablet (500 mg total) by mouth 2 (two) times daily as needed (pain). 03/29/22  Yes Barrett Henle, MD  cetirizine (ZYRTEC ALLERGY) 10 MG tablet Take 1 tablet (10 mg total) by mouth daily. 05/12/20   Hall-Potvin, [place], PA-C  cyclobenzaprine (FLEXERIL) 10 MG tablet Take 1 tablet (10 mg total) by mouth 2 (two) times daily as needed for muscle spasms. 12/12/21   Rafoth, Dorian Pod, MD  fluticasone (FLONASE) 50 MCG/ACT nasal spray Place 1 spray into both nostrils daily. 05/12/20   Hall-Potvin, [place], PA-C  propranolol ER (INDERAL LA) 60 MG 24 hr capsule Take 1 capsule (60 mg total) by mouth daily. To help prevent SVT. 02/24/22   Nyoka Lint, PA-C    Family History Family History  Problem Relation Age of Onset   Atrial fibrillation Father     Social History Social History   Tobacco Use   Smoking status: Never   Smokeless tobacco: Never  Substance Use Topics   Alcohol use: No   Drug use: No     Allergies   Loracarbef   Review of Systems Review of Systems   Physical Exam Triage Vital Signs ED Triage Vitals  Enc Vitals Group     BP 03/29/22 1436 118/82     Pulse Rate 03/29/22 1436 93  Resp 03/29/22 1436 12     Temp 03/29/22 1436 98 F (36.7 C)     Temp Source 03/29/22 1436 Oral     SpO2 03/29/22 1436 96 %     Weight --      Height --      Head Circumference --      Peak Flow --      Pain Score 03/29/22 1435 5     Pain Loc --      Pain Edu? --      Excl. in [place]? --    No data found.  Updated Vital Signs BP 118/82 (BP Location: Left Arm)   Pulse 93   Temp 98 F (36.7 C) (Oral)   Resp 12   SpO2 96%   Visual Acuity Right Eye Distance:   Left Eye Distance:   Bilateral Distance:    Right Eye Near:   Left Eye Near:    Bilateral Near:     Physical Exam Vitals reviewed.  Constitutional:      General: He is not in acute distress.     Appearance: He is not toxic-appearing.  Musculoskeletal:     Comments: There is some tenderness of the medial aspect of the right foot.  There is some possible mild swelling on the medial side.  There is no erythema or induration and no obvious deformity.  Pulses are 2+ and equal and capillary refill distally is normal  Skin:    Coloration: Skin is not jaundiced or pale.  Neurological:     Mental Status: He is alert.      UC Treatments / Results  Labs (all labs ordered are listed, but only abnormal results are displayed) Labs Reviewed - No data to display  EKG   Radiology DG Foot Complete Right  Result Date: 03/29/2022 CLINICAL DATA:  pain in the medial arch of the right foot EXAM: RIGHT FOOT COMPLETE - 3+ VIEW COMPARISON:  None Available. FINDINGS: No acute fracture or dislocation. Joint spaces and alignment are maintained. Periarticular cyst versus erosion of the head of the first metatarsal. No unexpected radiopaque foreign body. Soft tissues are unremarkable. IMPRESSION: 1. No acute fracture or dislocation. 2. Periarticular cyst versus erosion of the head of the first metatarsal. This could reflect nonspecific degenerative changes versus inflammatory arthropathy or crystal arthropathy such as gout. Electronically Signed   By: Valentino Saxon M.D.   On: 03/29/2022 15:46    Procedures Procedures (including critical care time)  Medications Ordered in UC Medications - No data to display  Initial Impression / Assessment and Plan / UC Course  I have reviewed the triage vital signs and the nursing notes.  Pertinent labs & imaging results that were available during my care of the patient were reviewed by me and considered in my medical decision making (see chart for details).        X-ray does not show any acute bony pathology of the area in question.  He does have some degenerative changes around the first MTP joint, but that is not where he is hurting.  He is placed in a  boot and naproxen is sent in for pain relief.  He is given contact information for podiatry Final Clinical Impressions(s) / UC Diagnoses   Final diagnoses:  Foot pain, right     Discharge Instructions      Your x-ray did not show any broken bone.  Take naproxen 500 mg--1 tablet every 12 hours as needed for pain  ED Prescriptions     Medication Sig Dispense Auth. Provider   naproxen (NAPROSYN) 500 MG tablet Take 1 tablet (500 mg total) by mouth 2 (two) times daily as needed (pain). 30 tablet Lam Mccubbins, Gwenlyn Perking, MD      PDMP not reviewed this encounter.   Barrett Henle, MD 03/29/22 [id number]

## 2022-03-29 NOTE — ED Triage Notes (Signed)
Pt is here today for right foot pain x4 days

## 2022-03-29 NOTE — Discharge Instructions (Signed)
Your x-ray did not show any broken bone.  Take naproxen 500 mg--1 tablet every 12 hours as needed for pain

## 2022-04-08 ENCOUNTER — Ambulatory Visit: Payer: 59 | Attending: Cardiovascular Disease | Admitting: Cardiovascular Disease

## 2022-04-08 ENCOUNTER — Encounter: Payer: Self-pay | Admitting: Cardiovascular Disease

## 2022-04-08 VITALS — BP 118/72 | HR 79 | Ht 66.0 in | Wt 216.0 lb

## 2022-04-08 DIAGNOSIS — I471 Supraventricular tachycardia, unspecified: Secondary | ICD-10-CM | POA: Diagnosis not present

## 2022-04-08 MED ORDER — PROPRANOLOL HCL ER 60 MG PO CP24: 60.0000 mg | ORAL_CAPSULE | Freq: Every day | ORAL | 3 refills | Status: AC

## 2022-04-08 NOTE — Progress Notes (Signed)
Cardiology Office Note:    Date:  04/08/2022   ID:  Ian Lewis, DOB [birth year], MRN [id number]  PCP:  Patient, No Pcp Per   [place] Providers Cardiologist:  None Electrophysiologist:  Melida Quitter, MD     Referring MD: No ref. provider found   Chief complaint: plalpitations  History of Present Illness:    Ian Lewis is a 31 y.o. male with a hx of syncope and SVT referred for arrhythmia management.  He has a history of palpitations and rapid heart rates for which he has previously seen my colleague, Dr. Curt Bears. A loop recorder was placed in September of 2019. Our last transmission was received in September, 2020. To-date, there has been no documentation of any arrhythmia.  He was seen by Dr. Geraldo Pitter in April of 2023 after he had a recurrence of syncope associated with palpitations resulting in an ER visit. The strips from the ER show normal rhythm. Per the patient, the EMS squad mentioned that he was in atrial fibrillation.  He had an episode of palpitations and rapid heart rates while at work last week and went to urgent care. He reports that he was told he had SVT at that visit and was started on propranolol. He has been feeling somewhat better on propranolol though it wears off midway through the day.  Follow-up 04/08/2022 Since his last visit, he has been taking the propranolol LA and has not had any recurrence of palpitations. He wore a 14 day monitor that did not show any arrhythmia.    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 Added automatically from request for surgery [id number]   PSVT (paroxysmal supraventricular tachycardia) 02/11/2019   Sensory loss 06/12/2018   Status post placement of implantable loop recorder 02/11/2019   SVT (supraventricular tachycardia)    Syncope     Vision abnormalities    Weakness of both legs 06/12/2018    Past Surgical History:  Procedure Laterality Date   KNEE SURGERY     LOOP RECORDER INSERTION N/A 02/05/2018   Procedure: LOOP RECORDER INSERTION;  Surgeon: Constance Haw, MD;  Location: [facility];  Service: Cardiovascular;  Laterality: N/A;   MANDIBLE FRACTURE SURGERY      Current Medications: Current Meds  Medication Sig   cetirizine (ZYRTEC ALLERGY) 10 MG tablet Take 1 tablet (10 mg total) by mouth daily.   cyclobenzaprine (FLEXERIL) 10 MG tablet Take 1 tablet (10 mg total) by mouth 2 (two) times daily as needed for muscle spasms.   fluticasone (FLONASE) 50 MCG/ACT nasal spray Place 1 spray into both nostrils daily.   naproxen (NAPROSYN) 500 MG tablet Take 1 tablet (500 mg total) by mouth 2 (two) times daily as needed (pain).   propranolol ER (INDERAL LA) 60 MG 24 hr capsule Take 1 capsule (60 mg total) by mouth daily. To help prevent SVT.     Allergies:   Loracarbef   Social History   Socioeconomic History   Marital status: Single    Spouse name: Not on file   Number of children: Not on file   Years of education: Not on file   Highest education level: Not on file  Occupational History   Not on file  Tobacco Use   Smoking status: Never   Smokeless tobacco: Never  Substance and Sexual Activity  Alcohol use: No   Drug use: No   Sexual activity: Not on file  Other Topics Concern   Not on file  Social History Narrative   Not on file   Social Determinants of Health   Financial Resource Strain: Not on file  Food Insecurity: Not on file  Transportation Needs: Not on file  Physical Activity: Not on file  Stress: Not on file  Social Connections: Not on file     Family History: The patient's family history includes Atrial fibrillation in his father.  ROS:   Please see the history of present illness.    All other systems reviewed and are negative.  EKGs/Labs/Other Studies Reviewed:     14 Day monitor Patch Wear Time:  14 days and 0 hours (2023-10-20T16:53:14-0400 to 2023-11-03T16:53:18-0400)   Patient had a min HR of 40 bpm, max HR of 169 bpm, and avg HR of 86 bpm. Predominant underlying rhythm was Sinus Rhythm. Isolated SVEs were rare (<1.0%), and no SVE Couplets or SVE Triplets were present. Isolated VEs were rare (<1.0%), VE Couplets were  rare (<1.0%), and no VE Triplets were present.   TTE 03/03/2018 Heart is structurally and functionally normal  EKG:  Last EKG results: today --  sinus rhythm 74 bpm   Recent Labs: 07/21/2021: ALT 14 02/22/2022: BUN 13; Creatinine, Ser 1.00; Hemoglobin 16.6; Magnesium 2.1; Platelets 304; Potassium 3.8; Sodium 137      Physical Exam:    VS:  BP 118/72   Pulse 79   Ht '5\' 6"'$  (1.676 m)   Wt 216 lb (98 kg)   SpO2 96%   BMI 34.86 kg/m     Wt Readings from Last 3 Encounters:  04/08/22 216 lb (98 kg)  02/26/22 222 lb (100.7 kg)  02/22/22 210 lb 1.6 oz (95.3 kg)     GEN: Well nourished, well developed in no acute distress CARDIAC: RRR, no murmurs, rubs, gallops RESPIRATORY:  Normal work of breathing MUSCULOSKELETAL: no edema    ASSESSMENT & PLAN:    Paroxysmal SVT, palpitations: continue propranolol. 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 Will monitor          Medication Adjustments/Labs and Tests Ordered: Current medicines are reviewed at length with the patient today.  Concerns regarding medicines are outlined above.  No orders of the defined types were placed in this encounter.  No orders of the defined types were placed in this encounter.    Signed, Melida Quitter, MD  04/08/2022 11:25 AM    [place]

## 2022-04-08 NOTE — Patient Instructions (Signed)
Medication Instructions:  Your physician recommends that you continue on your current medications as directed. Please refer to the Current Medication list given to you today.  *If you need a refill on your cardiac medications before your next appointment, please call your pharmacy*   Follow-Up: At [facility], you and your health needs are our priority.  As part of our continuing mission to provide you with exceptional heart care, we have created designated Provider Care Teams.  These Care Teams include your primary Cardiologist (physician) and Advanced Practice Providers (APPs -  Physician Assistants and Nurse Practitioners) who all work together to provide you with the care you need, when you need it.  Your next appointment:   1 year(s)  The format for your next appointment:   In Person  Provider:   You may see Augustus E Mealor, MD or one of the following Advanced Practice Providers on your designated Care Team:   Renee Ursuy, PA-C Michael "Andy" Tillery, PA-C    Important Information About Sugar       

## 2022-06-28 ENCOUNTER — Other Ambulatory Visit: Payer: Self-pay

## 2022-06-28 ENCOUNTER — Emergency Department (HOSPITAL_COMMUNITY): Payer: 59

## 2022-06-28 ENCOUNTER — Encounter (HOSPITAL_COMMUNITY): Payer: Self-pay | Admitting: Emergency Medicine

## 2022-06-28 ENCOUNTER — Emergency Department (HOSPITAL_COMMUNITY)
Admission: EM | Admit: 2022-06-28 | Discharge: 2022-06-28 | Disposition: A | Discharge status: Home / Self Care | Payer: 59 | Attending: Emergency Medicine | Admitting: Emergency Medicine

## 2022-06-28 DIAGNOSIS — R1011 Right upper quadrant pain: Secondary | ICD-10-CM | POA: Diagnosis not present

## 2022-06-28 DIAGNOSIS — K760 Fatty (change of) liver, not elsewhere classified: Secondary | ICD-10-CM | POA: Diagnosis not present

## 2022-06-28 DIAGNOSIS — R059 Cough, unspecified: Secondary | ICD-10-CM | POA: Diagnosis not present

## 2022-06-28 DIAGNOSIS — R112 Nausea with vomiting, unspecified: Secondary | ICD-10-CM

## 2022-06-28 DIAGNOSIS — R109 Unspecified abdominal pain: Secondary | ICD-10-CM | POA: Diagnosis not present

## 2022-06-28 DIAGNOSIS — R1110 Vomiting, unspecified: Secondary | ICD-10-CM | POA: Diagnosis not present

## 2022-06-28 DIAGNOSIS — R079 Chest pain, unspecified: Secondary | ICD-10-CM | POA: Diagnosis not present

## 2022-06-28 LAB — COMPREHENSIVE METABOLIC PANEL
ALT: 14 U/L (ref 0–44)
AST: 20 U/L (ref 15–41)
Albumin: 3.7 g/dL (ref 3.5–5.0)
Alkaline Phosphatase: 52 U/L (ref 38–126)
Anion gap: 6 (ref 5–15)
BUN: 14 mg/dL (ref 6–20)
CO2: 25 mmol/L (ref 22–32)
Calcium: 8.1 mg/dL — ABNORMAL LOW (ref 8.9–10.3)
Chloride: 102 mmol/L (ref 98–111)
Creatinine, Ser: 0.93 mg/dL (ref 0.61–1.24)
GFR, Estimated: >60 mL/min (ref >60)
Glucose, Bld: 102 mg/dL — ABNORMAL HIGH (ref 70–99)
Potassium: 3.8 mmol/L (ref 3.5–5.1)
Sodium: 133 mmol/L — ABNORMAL LOW (ref 135–145)
Total Bilirubin: 1.2 mg/dL (ref 0.3–1.2)
Total Protein: 6.3 g/dL — ABNORMAL LOW (ref 6.5–8.1)

## 2022-06-28 LAB — CBC WITH DIFFERENTIAL/PLATELET
Abs Immature Granulocytes: 0.04 10*3/uL (ref 0.00–0.07)
Basophils Absolute: 0.1 10*3/uL (ref 0.0–0.1)
Basophils Relative: 1 %
Eosinophils Absolute: 0.1 10*3/uL (ref 0.0–0.5)
Eosinophils Relative: 1 %
HCT: 51.6 % (ref 39.0–52.0)
Hemoglobin: 17.9 g/dL — ABNORMAL HIGH (ref 13.0–17.0)
Immature Granulocytes: 0 %
Lymphocytes Relative: 25 %
Lymphs Abs: 2.7 10*3/uL (ref 0.7–4.0)
MCH: 29.2 pg (ref 26.0–34.0)
MCHC: 34.7 g/dL (ref 30.0–36.0)
MCV: 84.3 fL (ref 80.0–100.0)
Monocytes Absolute: 0.8 10*3/uL (ref 0.1–1.0)
Monocytes Relative: 7 %
Neutro Abs: 7 10*3/uL (ref 1.7–7.7)
Neutrophils Relative %: 66 %
Platelets: 326 10*3/uL (ref 150–400)
RBC: 6.12 MIL/uL — ABNORMAL HIGH (ref 4.22–5.81)
RDW: 12.5 % (ref 11.5–15.5)
WBC: 10.6 10*3/uL — ABNORMAL HIGH (ref 4.0–10.5)
nRBC: 0 % (ref 0.0–0.2)

## 2022-06-28 LAB — URINALYSIS, ROUTINE W REFLEX MICROSCOPIC
Bilirubin Urine: NEGATIVE
Glucose, UA: NEGATIVE mg/dL
Hgb urine dipstick: NEGATIVE
Ketones, ur: 5 mg/dL — AB
Leukocytes,Ua: NEGATIVE
Nitrite: NEGATIVE
Protein, ur: NEGATIVE mg/dL
Specific Gravity, Urine: 1.028 (ref 1.005–1.030)
pH: 6 (ref 5.0–8.0)

## 2022-06-28 LAB — LIPASE, BLOOD: Lipase: 33 U/L (ref 11–51)

## 2022-06-28 MED ORDER — IOHEXOL 300 MG/ML  SOLN
100.0000 mL | Freq: Once | INTRAMUSCULAR | Status: AC | PRN
  Administered 2022-06-28: 100 mL via INTRAVENOUS

## 2022-06-28 MED ORDER — SODIUM CHLORIDE 0.9 % IV BOLUS
1000.0000 mL | Freq: Once | INTRAVENOUS | Status: AC
  Administered 2022-06-28: 1000 mL via INTRAVENOUS

## 2022-06-28 MED ORDER — ONDANSETRON HCL 4 MG/2ML IJ SOLN
4.0000 mg | Freq: Once | INTRAMUSCULAR | Status: AC
  Administered 2022-06-28: 4 mg via INTRAVENOUS
  Filled 2022-06-28: qty 2

## 2022-06-28 MED ORDER — ONDANSETRON 4 MG PO TBDP: ORAL_TABLET | ORAL | 0 refills | Status: DC

## 2022-06-28 NOTE — ED Provider Notes (Signed)
[facility] Provider Note   CSN: [id number] Arrival date & time: 06/28/22  1637     History  Chief Complaint  Patient presents with   Abdominal Pain    Ian Lewis is a 32 y.o. male here presenting with abdominal pain and vomiting.  Patient states that he started having bilious vomiting today.  He states that he is unable to keep anything down.  He also has nonproductive cough as well.  Patient denies any history of gallbladder problems or cholecystectomy.  The history is provided by the patient.       Home Medications Prior to Admission medications   Medication Sig Start Date End Date Taking? Authorizing Provider  cetirizine (ZYRTEC ALLERGY) 10 MG tablet Take 1 tablet (10 mg total) by mouth daily. 05/12/20   Hall-Potvin, [place], PA-C  cyclobenzaprine (FLEXERIL) 10 MG tablet Take 1 tablet (10 mg total) by mouth 2 (two) times daily as needed for muscle spasms. 12/12/21   Rafoth, Dorian Pod, MD  fluticasone (FLONASE) 50 MCG/ACT nasal spray Place 1 spray into both nostrils daily. 05/12/20   Hall-Potvin, [place], PA-C  naproxen (NAPROSYN) 500 MG tablet Take 1 tablet (500 mg total) by mouth 2 (two) times daily as needed (pain). 03/29/22   Barrett Henle, MD  propranolol ER (INDERAL LA) 60 MG 24 hr capsule Take 1 capsule (60 mg total) by mouth daily. To help prevent SVT. 04/08/22   Mealor, Yetta Barre, MD      Allergies    Loracarbef    Review of Systems   Review of Systems  Gastrointestinal:  Positive for abdominal pain and vomiting.  All other systems reviewed and are negative.   Physical Exam Updated Vital Signs BP 115/87   Pulse 77   Temp 98 F (36.7 C)   Resp 17   Ht 5' 6"$  (1.676 m)   Wt 98 kg   SpO2 97%   BMI 34.86 kg/m  Physical Exam Vitals and nursing note reviewed.  Constitutional:      Comments: Uncomfortable  HENT:     Head: Normocephalic.     Mouth/Throat:     Pharynx: Oropharynx is clear.  Eyes:      Extraocular Movements: Extraocular movements intact.     Pupils: Pupils are equal, round, and reactive to light.  Cardiovascular:     Rate and Rhythm: Normal rate and regular rhythm.     Heart sounds: Normal heart sounds.  Pulmonary:     Effort: Pulmonary effort is normal.     Breath sounds: Normal breath sounds.  Abdominal:     Comments: + Right upper quadrant tenderness.  Skin:    General: Skin is warm.     Capillary Refill: Capillary refill takes less than 2 seconds.  Neurological:     General: No focal deficit present.     Mental Status: He is oriented to person, place, and time.  Psychiatric:        Mood and Affect: Mood normal.        Behavior: Behavior normal.     ED Results / Procedures / Treatments   Labs (all labs ordered are listed, but only abnormal results are displayed) Labs Reviewed  CBC WITH DIFFERENTIAL/PLATELET - Abnormal; Notable for the following components:      Result Value   WBC 10.6 (*)    RBC 6.12 (*)    Hemoglobin 17.9 (*)    All other components within normal limits  COMPREHENSIVE METABOLIC  PANEL - Abnormal; Notable for the following components:   Sodium 133 (*)    Glucose, Bld 102 (*)    Calcium 8.1 (*)    Total Protein 6.3 (*)    All other components within normal limits  URINALYSIS, ROUTINE W REFLEX MICROSCOPIC - Abnormal; Notable for the following components:   Ketones, ur 5 (*)    All other components within normal limits  LIPASE, BLOOD    EKG None  Radiology DG Chest 2 View  Result Date: 06/28/2022 CLINICAL DATA:  Cough and pain for 2 days, initial encounter EXAM: CHEST - 2 VIEW COMPARISON:  02/22/2022 FINDINGS: Cardiac shadow is within normal limits. Loop recorder is again seen. Lungs are clear bilaterally. No bony abnormality is seen. IMPRESSION: No active cardiopulmonary disease. Electronically Signed   By: Inez Catalina M.D.   On: 06/28/2022 21:10   CT ABDOMEN PELVIS W CONTRAST  Result Date: 06/28/2022 CLINICAL DATA:   Abdominal pain and vomiting for 2 days EXAM: CT ABDOMEN AND PELVIS WITH CONTRAST TECHNIQUE: Multidetector CT imaging of the abdomen and pelvis was performed using the standard protocol following bolus administration of intravenous contrast. RADIATION DOSE REDUCTION: This exam was performed according to the departmental dose-optimization program which includes automated exposure control, adjustment of the mA and/or kV according to patient size and/or use of iterative reconstruction technique. CONTRAST:  145m OMNIPAQUE IOHEXOL 300 MG/ML  SOLN COMPARISON:  07/21/2021 FINDINGS: Lower chest: No acute abnormality. Hepatobiliary: No solid liver abnormality is seen. No gallstones, gallbladder wall thickening, or biliary dilatation. Pancreas: Unremarkable. No pancreatic ductal dilatation or surrounding inflammatory changes. Spleen: Splenomegaly, maximum coronal span 14.3 cm. Adrenals/Urinary Tract: Adrenal glands are unremarkable. Kidneys are normal, without renal calculi, solid lesion, or hydronephrosis. Bladder is unremarkable. Stomach/Bowel: Stomach is within normal limits. Appendix appears normal. No evidence of bowel wall thickening, distention, or inflammatory changes. Vascular/Lymphatic: No significant vascular findings are present. No enlarged abdominal or pelvic lymph nodes. Reproductive: No mass or other significant abnormality. Other: Small, fat containing left inguinal hernia.  No ascites. Musculoskeletal: No acute or significant osseous findings. IMPRESSION: 1. No acute CT findings of the abdomen or pelvis to explain abdominal pain. 2. Splenomegaly. 3. Small, fat containing left inguinal hernia. Electronically Signed   By: ADelanna AhmadiM.D.   On: 06/28/2022 21:05   UKoreaAbdomen Limited RUQ (LIVER/GB)  Result Date: 06/28/2022 CLINICAL DATA:  Right upper quadrant abdominal pain. EXAM: ULTRASOUND ABDOMEN LIMITED RIGHT UPPER QUADRANT COMPARISON:  CT abdomen pelvis dated 07/21/2021. FINDINGS: Gallbladder: No  gallstones or wall thickening visualized. No sonographic Murphy sign noted by sonographer. Common bile duct: Diameter: 4 mm Liver: There is diffuse increased liver echogenicity most commonly seen in the setting of fatty infiltration. Superimposed inflammation or fibrosis is not excluded. Clinical correlation is recommended. Portal vein is patent on color Doppler imaging with normal direction of blood flow towards the liver. Other: None. IMPRESSION: Fatty liver, otherwise unremarkable right upper quadrant ultrasound. Electronically Signed   By: AAnner CreteM.D.   On: 06/28/2022 20:41    Procedures Procedures    Medications Ordered in ED Medications  sodium chloride 0.9 % bolus 1,000 mL (0 mLs Intravenous Stopped 06/28/22 2039)  ondansetron (ZOFRAN) injection 4 mg (4 mg Intravenous Given 06/28/22 1954)  iohexol (OMNIPAQUE) 300 MG/ML solution 100 mL (100 mLs Intravenous Contrast Given 06/28/22 2043)    ED Course/ Medical Decision Making/ A&P  Medical Decision Making BUSH BUEHRING is a 32 y.o. male here presenting with bilious vomiting and abdominal pain.  Consider biliary colic versus gastroenteritis versus pneumonia.  Plan to get CBC and CMP and lipase and CT abdomen pelvis and right upper quadrant ultrasound and chest x-ray.  10:05 PM I reviewed patient's labs and independently interpreted imaging studies.  CBC CMP and lipase unremarkable urinalysis normal.  Right upper quadrant ultrasound and CT abdomen pelvis and chest x-ray unremarkable.  Patient is feeling better after IV fluids and Zofran.  Patient likely has viral gastroenteritis.  Stable for discharge.   Problems Addressed: Nausea and vomiting, unspecified vomiting type: acute illness or injury RUQ pain: acute illness or injury  Amount and/or Complexity of Data Reviewed Labs: ordered. Decision-making details documented in ED Course. Radiology: ordered and independent interpretation performed.  Decision-making details documented in ED Course.  Risk Prescription drug management.    Final Clinical Impression(s) / ED Diagnoses Final diagnoses:  None    Rx / DC Orders ED Discharge Orders     None         Drenda Freeze, MD 06/28/22 2206

## 2022-06-28 NOTE — ED Triage Notes (Signed)
Patient report abdominal pain x 2 days. Pt report greenish emesis x4 since yesterday. Pt unable to eat and drink since yesterday. Pt a/xo4.

## 2022-06-28 NOTE — ED Provider Triage Note (Signed)
Emergency Medicine Provider Triage Evaluation Note  Ian Lewis , a 32 y.o. male  was evaluated in triage.  Patient complains of nausea and vomiting since yesterday.  Patient reports having sinus symptoms last week with associated nausea and vomiting but that resolved until yesterday.  He reports 3 episodes of nbnb vomiting today.  No fever, cough, runny nose, chest pain, shortness of breath, bowel changes, urinary symptoms.  He reports diffuse abdominal pain.  No bowel changes or urinary symptoms.  Review of Systems  Positive: As above Negative: As above  Physical Exam  BP (!) 114/90   Pulse (!) 105   Temp 98 F (36.7 C)   Resp 16   Ht 5' 6"$  (1.676 m)   Wt 98 kg   SpO2 100%   BMI 34.86 kg/m  Gen:   Awake, no distress   Resp:  Normal effort  MSK:   Moves extremities without difficulty  Other:    Medical Decision Making  Medically screening exam initiated at 5:08 PM.  Appropriate orders placed.  Ian Lewis was informed that the remainder of the evaluation will be completed by another provider, this initial triage assessment does not replace that evaluation, and the importance of remaining in the ED until their evaluation is complete.     Rex Kras, [place] 06/29/22 [phone]

## 2022-06-28 NOTE — Discharge Instructions (Signed)
Take Zofran for nausea and stay hydrated  See your doctor for follow-up.  You likely have a stomach virus.  Your CT scan and ultrasound and x-rays were unremarkable  Return to ER if you have worse vomiting, abdominal pain

## 2022-08-27 ENCOUNTER — Ambulatory Visit: Payer: 59 | Admitting: Family Medicine

## 2022-09-10 ENCOUNTER — Other Ambulatory Visit (HOSPITAL_COMMUNITY): Payer: Self-pay | Admitting: Gastroenterology

## 2022-09-10 DIAGNOSIS — R198 Other specified symptoms and signs involving the digestive system and abdomen: Secondary | ICD-10-CM | POA: Diagnosis not present

## 2022-09-10 DIAGNOSIS — R112 Nausea with vomiting, unspecified: Secondary | ICD-10-CM | POA: Diagnosis not present

## 2022-09-24 DIAGNOSIS — K2930 Chronic superficial gastritis without bleeding: Secondary | ICD-10-CM | POA: Diagnosis not present

## 2022-09-24 DIAGNOSIS — R112 Nausea with vomiting, unspecified: Secondary | ICD-10-CM | POA: Diagnosis not present

## 2022-09-24 DIAGNOSIS — K3189 Other diseases of stomach and duodenum: Secondary | ICD-10-CM | POA: Diagnosis not present

## 2022-09-24 DIAGNOSIS — K2100 Gastro-esophageal reflux disease with esophagitis, without bleeding: Secondary | ICD-10-CM | POA: Diagnosis not present

## 2022-10-25 ENCOUNTER — Ambulatory Visit: Payer: 59 | Admitting: Family Medicine

## 2022-12-26 ENCOUNTER — Ambulatory Visit: Payer: 59 | Admitting: Family Medicine

## 2023-02-19 ENCOUNTER — Ambulatory Visit: Payer: 59 | Admitting: Family Medicine

## 2023-03-11 ENCOUNTER — Telehealth: Payer: Self-pay | Admitting: Cardiovascular Disease

## 2023-03-11 NOTE — Telephone Encounter (Signed)
New Message:    Patient says he would like to have his Loop Recorder removed please He says it no longer works.,.

## 2023-03-11 NOTE — Telephone Encounter (Signed)
Called pt reports would like to have ILR removed hasn't worked in years and never got around to having it removed.  Advised pt I will send a message to scheduler to get procedure scheduled.  Will also send message to covering nurse to follow.

## 2023-04-08 ENCOUNTER — Encounter: Payer: Self-pay | Admitting: Cardiovascular Disease

## 2023-04-08 ENCOUNTER — Ambulatory Visit: Payer: 59 | Attending: Cardiovascular Disease | Admitting: Cardiovascular Disease

## 2023-04-08 VITALS — BP 164/94 | HR 84 | Ht 66.0 in | Wt 226.2 lb

## 2023-04-08 DIAGNOSIS — R55 Syncope and collapse: Secondary | ICD-10-CM

## 2023-04-08 DIAGNOSIS — R002 Palpitations: Secondary | ICD-10-CM

## 2023-04-08 DIAGNOSIS — I471 Supraventricular tachycardia, unspecified: Secondary | ICD-10-CM | POA: Diagnosis not present

## 2023-04-08 NOTE — Patient Instructions (Signed)
Medication Instructions:  Your physician recommends that you continue on your current medications as directed. Please refer to the Current Medication list given to you today.  Labwork: None ordered.  Testing/Procedures: None ordered.  Follow-Up:  Your physician wants you to follow-up in: one year with Dr York Pellant.  You will receive a reminder letter in the mail two months in advance. If you don't receive a letter, please call our office to schedule the follow-up appointment.    Implantable Loop Recorder Removal, Care After This sheet gives you information about how to care for yourself after your procedure. Your health care provider may also give you more specific instructions. If you have problems or questions, contact your health care provider. What can I expect after the procedure? After the procedure, it is common to have: Soreness or discomfort near the incision. Some swelling or bruising near the incision.  Follow these instructions at home: Incision care  Monitor your cardiac device site for redness, swelling, and drainage. Call the device clinic at [phone] if you experience these symptoms or fever/chills.  Keep the large square bandage on your site for 24 hours and then you may remove it yourself. Keep the steri-strips underneath in place.   You may shower after 72 hours / 3 days from your procedure with the steri-strips in place. They will usually fall off on their own, or may be removed after 10 days. Pat dry.   Avoid lotions, ointments, or perfumes over your incision until it is well-healed.  Please do not submerge in water until your site is completely healed.   If your wound site starts to bleed apply pressure.       If you have any questions/concerns please call the device clinic at [phone].  Activity  Return to your normal activities.  Contact a health care provider if: You have redness, swelling, or pain around your incision. You have a  fever.

## 2023-04-08 NOTE — Progress Notes (Signed)
Cardiology Office Note:    Date:  04/08/2023   ID:  Ian Lewis, DOB [birth year], MRN [id number]  PCP:  Patient, No Pcp Per   [facility] Providers Cardiologist:  None Electrophysiologist:  Maurice Small, MD     Referring MD: No ref. provider found   Chief complaint: plalpitations  History of Present Illness:    Ian Lewis is a 32 y.o. male with a hx of syncope and SVT referred for arrhythmia management.  He has a history of palpitations and rapid heart rates for which he has previously seen my colleague, Dr. Elberta Fortis. A loop recorder was placed in September of 2019. Our last transmission was received in September, 2020. To-date, there has been no documentation of any arrhythmia.  He was seen by Dr. Tomie China in April of 2023 after he had a recurrence of syncope associated with palpitations resulting in an ER visit. The strips from the ER show normal rhythm. Per the patient, the EMS squad mentioned that he was in atrial fibrillation.  He had an episode of palpitations and rapid heart rates while at work and went to urgent care. He was told he had SVT at that visit and was started on propranolol. He has been feeling somewhat better on propranolol though it wears off midway through the day. On propranolol LA he had not had any recurrence of palpitations. He wore a 14 day monitor that did not show any arrhythmia.   Today, he presents to have his loop monitor removed.  He reports that he continues to do very well with the propranolol and has not had any recurrence of episodes.    EKGs/Labs/Other Studies Reviewed:    14 Day monitor Patch Wear Time:  14 days and 0 hours (2023-10-20T16:53:14-0400 to 2023-11-03T16:53:18-0400)   Patient had a min HR of 40 bpm, max HR of 169 bpm, and avg HR of 86 bpm. Predominant underlying rhythm was Sinus Rhythm. Isolated SVEs were rare (<1.0%), and no SVE Couplets or SVE Triplets were present. Isolated VEs were rare (<1.0%), VE  Couplets were  rare (<1.0%), and no VE Triplets were present.   TTE 03/03/2018 Heart is structurally and functionally normal  EKG:  Last EKG results: today --  sinus rhythm 74 bpm   Recent Labs: 06/28/2022: ALT 14; BUN 14; Creatinine, Ser 0.93; Hemoglobin 17.9; Platelets 326; Potassium 3.8; Sodium 133      Physical Exam:    VS:  BP (!) 164/94 (BP Location: Left Arm, Patient Position: Sitting, Cuff Size: Large)   Pulse 84   Ht 5\' 6"  (1.676 m)   Wt 226 lb 3.2 oz (102.6 kg)   SpO2 98%   BMI 36.51 kg/m     Wt Readings from Last 3 Encounters:  04/08/23 226 lb 3.2 oz (102.6 kg)  06/28/22 216 lb (98 kg)  04/08/22 216 lb (98 kg)     GEN: Well nourished, well developed in no acute distress CARDIAC: RRR, no murmurs, rubs, gallops RESPIRATORY:  Normal work of breathing MUSCULOSKELETAL: no edema    ASSESSMENT & PLAN:    Paroxysmal SVT, palpitations:  - continue propranolol.   Syncope:  - suspect neurocardiogenic.  - no recurrences recently  ILR in place - battery is dead - he would like to have the device removed   I explained the loop removal procedure to the patient including risks of minor infection and minor bleeding.  He would like to proceed  After obtaining informed consent, the chest was prepped and  draped in usual sterile fashion.  The skin above the medial end of the loop recorder was infiltrated with local anesthetic.  A 1.5 cm incision was made and the device easily withdrawn from under the skin with sterile forceps.  Blood loss was minimal and there were no complications.  2 Steri-Strips and a sterile dressing were applied.   Medication Adjustments/Labs and Tests Ordered: Current medicines are reviewed at length with the patient today.  Concerns regarding medicines are outlined above.  Orders Placed This Encounter  Procedures   EKG 12-Lead   No orders of the defined types were placed in this encounter.    Signed, Maurice Small, MD  04/08/2023  8:57 AM    [facility]

## 2023-04-29 ENCOUNTER — Ambulatory Visit: Payer: 59 | Admitting: Family Medicine

## 2023-06-10 ENCOUNTER — Encounter: Payer: Self-pay | Admitting: Family Medicine

## 2023-06-10 ENCOUNTER — Ambulatory Visit (INDEPENDENT_AMBULATORY_CARE_PROVIDER_SITE_OTHER): Payer: Self-pay | Admitting: Family Medicine

## 2023-06-10 VITALS — BP 124/78 | HR 105 | Temp 97.6°F | Ht 66.0 in | Wt 227.0 lb

## 2023-06-10 DIAGNOSIS — R1111 Vomiting without nausea: Secondary | ICD-10-CM

## 2023-06-10 DIAGNOSIS — E669 Obesity, unspecified: Secondary | ICD-10-CM

## 2023-06-10 DIAGNOSIS — K2100 Gastro-esophageal reflux disease with esophagitis, without bleeding: Secondary | ICD-10-CM

## 2023-06-10 DIAGNOSIS — Z6836 Body mass index (BMI) 36.0-36.9, adult: Secondary | ICD-10-CM

## 2023-06-10 DIAGNOSIS — Z9889 Other specified postprocedural states: Secondary | ICD-10-CM

## 2023-06-10 LAB — CBC WITH DIFFERENTIAL/PLATELET
Basophils Absolute: 0.1 10*3/uL (ref 0.0–0.1)
Basophils Relative: 1 % (ref 0.0–3.0)
Eosinophils Absolute: 0.2 10*3/uL (ref 0.0–0.7)
Eosinophils Relative: 2.4 % (ref 0.0–5.0)
HCT: 47.2 % (ref 39.0–52.0)
Hemoglobin: 16.4 g/dL (ref 13.0–17.0)
Lymphocytes Relative: 27 % (ref 12.0–46.0)
Lymphs Abs: 1.8 10*3/uL (ref 0.7–4.0)
MCHC: 34.7 g/dL (ref 30.0–36.0)
MCV: 86.3 fL (ref 78.0–100.0)
Monocytes Absolute: 0.5 10*3/uL (ref 0.1–1.0)
Monocytes Relative: 7 % (ref 3.0–12.0)
Neutro Abs: 4.1 10*3/uL (ref 1.4–7.7)
Neutrophils Relative %: 62.6 % (ref 43.0–77.0)
Platelets: 275 10*3/uL (ref 150.0–400.0)
RBC: 5.47 Mil/uL (ref 4.22–5.81)
RDW: 13.5 % (ref 11.5–15.5)
WBC: 6.5 10*3/uL (ref 4.0–10.5)

## 2023-06-10 LAB — COMPREHENSIVE METABOLIC PANEL
ALT: 16 U/L (ref 0–53)
AST: 19 U/L (ref 0–37)
Albumin: 4.4 g/dL (ref 3.5–5.2)
Alkaline Phosphatase: 69 U/L (ref 39–117)
BUN: 14 mg/dL (ref 6–23)
CO2: 28 meq/L (ref 19–32)
Calcium: 9.4 mg/dL (ref 8.4–10.5)
Chloride: 103 meq/L (ref 96–112)
Creatinine, Ser: 0.92 mg/dL (ref 0.40–1.50)
GFR: 111.35 mL/min (ref >60.00)
Glucose, Bld: 99 mg/dL (ref 70–99)
Potassium: 4.2 meq/L (ref 3.5–5.1)
Sodium: 137 meq/L (ref 135–145)
Total Bilirubin: 1.1 mg/dL (ref 0.2–1.2)
Total Protein: 7.2 g/dL (ref 6.0–8.3)

## 2023-06-10 LAB — LIPASE: Lipase: 23 U/L (ref 11.0–59.0)

## 2023-06-10 MED ORDER — PANTOPRAZOLE SODIUM 40 MG PO TBEC: 40.0000 mg | DELAYED_RELEASE_TABLET | Freq: Every day | ORAL | 1 refills | Status: DC

## 2023-06-10 NOTE — Progress Notes (Signed)
New Patient Office Visit  Subjective    Patient ID: Ian Lewis, male    DOB: 1991-01-07  Age: 33 y.o. MRN: [id number]  CC:  Chief Complaint  Patient presents with   Establish Care    Hx of gastritis and endoscopy done. Every now and then it flares up and wants to know if he needs to take otc medication or go to specialist     HPI Ian Lewis presents to establish care No PCP in years  Sees cardiologist annually.   States he saw [facility] last year and had an EGD due to uncontrolled GERD. States he took medication following his procedure by has not had refills.    States he cannot schedule with [facility]. Prefers to est with [facility]. Plans to start working for [employer] he is having severe flare ups of acid reflux to the point he wakes up vomiting.  Last episode 4 weeks ago.   Takes ibuprofen sporadically  Denies alcohol use  Denies abdominal pain. No weight loss   Works as a Electrical engineer.     Outpatient Encounter Medications as of 06/10/2023  Medication Sig   pantoprazole (PROTONIX) 40 MG tablet Take 1 tablet (40 mg total) by mouth daily.   propranolol ER (INDERAL LA) 60 MG 24 hr capsule Take 1 capsule (60 mg total) by mouth daily. To help prevent SVT.   [DISCONTINUED] cetirizine (ZYRTEC ALLERGY) 10 MG tablet Take 1 tablet (10 mg total) by mouth daily. (Patient not taking: Reported on 06/10/2023)   [DISCONTINUED] cyclobenzaprine (FLEXERIL) 10 MG tablet Take 1 tablet (10 mg total) by mouth 2 (two) times daily as needed for muscle spasms. (Patient not taking: Reported on 06/10/2023)   [DISCONTINUED] fluticasone (FLONASE) 50 MCG/ACT nasal spray Place 1 spray into both nostrils daily. (Patient not taking: Reported on 06/10/2023)   [DISCONTINUED] naproxen (NAPROSYN) 500 MG tablet Take 1 tablet (500 mg total) by mouth 2 (two) times daily as needed (pain). (Patient not taking: Reported on 06/10/2023)   [DISCONTINUED] ondansetron (ZOFRAN-ODT) 4 MG disintegrating tablet  4mg  ODT q4 hours prn nausea/vomit (Patient not taking: Reported on 06/10/2023)   No facility-administered encounter medications on file as of 06/10/2023.    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 Added automatically from request for surgery [id number]   PSVT (paroxysmal supraventricular tachycardia) (HCC) 02/11/2019   Sensory loss 06/12/2018   Status post placement of implantable loop recorder 02/11/2019   SVT (supraventricular tachycardia) (HCC)    Syncope    Vision abnormalities    Weakness of both legs 06/12/2018    Past Surgical History:  Procedure Laterality Date   KNEE SURGERY     LOOP RECORDER INSERTION N/A 02/05/2018   Procedure: LOOP RECORDER INSERTION;  Surgeon: Regan Lemming, MD;  Location: [facility];  Service: Cardiovascular;  Laterality: N/A;   MANDIBLE FRACTURE SURGERY      Family History  Problem Relation Age of Onset   Atrial fibrillation Father     Social History   Socioeconomic History   Marital status: Single    Spouse name: Not on file   Number of children: Not on file   Years of education: Not on file   Highest education level: Not on file  Occupational History   Not on file  Tobacco Use  Smoking status: Never   Smokeless tobacco: Never  Substance and Sexual Activity   Alcohol use: No   Drug use: No   Sexual activity: Not on file  Other Topics Concern   Not on file  Social History Narrative   Not on file   Social Drivers of Health   Financial Resource Strain: Not on file  Food Insecurity: Not on file  Transportation Needs: Not on file  Physical Activity: Not on file  Stress: Not on file  Social Connections: Not on file  Intimate Partner Violence: Not on file    Review of Systems  Constitutional:  Negative for chills, fever and weight loss.  Respiratory:   Negative for cough and shortness of breath.   Cardiovascular:  Negative for chest pain, palpitations and leg swelling.  Gastrointestinal:  Positive for heartburn and vomiting. Negative for abdominal pain, blood in stool, constipation, diarrhea and nausea.  Genitourinary:  Negative for dysuria, frequency and urgency.  Neurological:  Negative for dizziness and focal weakness.        Objective    BP 124/78 (BP Location: Left Arm, Patient Position: Sitting, Cuff Size: Large)   Pulse (!) 105   Temp 97.6 F (36.4 C) (Temporal)   Ht 5\' 6"  (1.676 m)   Wt 227 lb (103 kg)   SpO2 97%   BMI 36.64 kg/m   Physical Exam Constitutional:      General: He is not in acute distress.    Appearance: He is not ill-appearing.  Eyes:     Extraocular Movements: Extraocular movements intact.     Conjunctiva/sclera: Conjunctivae normal.  Cardiovascular:     Rate and Rhythm: Normal rate and regular rhythm.  Pulmonary:     Effort: Pulmonary effort is normal.     Breath sounds: Normal breath sounds.  Abdominal:     General: Bowel sounds are normal. There is no distension.     Palpations: Abdomen is soft.     Tenderness: There is no abdominal tenderness. There is no guarding or rebound.  Musculoskeletal:     Cervical back: Normal range of motion and neck supple.  Skin:    General: Skin is warm and dry.  Neurological:     General: No focal deficit present.     Mental Status: He is alert and oriented to person, place, and time.  Psychiatric:        Mood and Affect: Mood normal.        Behavior: Behavior normal.        Thought Content: Thought content normal.         Assessment & Plan:   Problem List Items Addressed This Visit   None Visit Diagnoses       Gastroesophageal reflux disease with esophagitis without hemorrhage    -  Primary   Relevant Medications   pantoprazole (PROTONIX) 40 MG tablet   Other Relevant Orders   Ambulatory referral to Gastroenterology     Vomiting without  nausea, unspecified vomiting type       Relevant Orders   CBC with Differential/Platelet (Completed)   Comprehensive metabolic panel (Completed)   Lipase (Completed)   Ambulatory referral to Gastroenterology     Obesity (BMI 30-39.9)         History of esophagogastroduodenoscopy (EGD)       Relevant Orders   Ambulatory referral to Gastroenterology      He is here to est care and with c/o GERD and intermittent vomiting, no hematemesis. No red flag  symptoms. Protonix prescribed. Avoid triggers and lifestyle modifications to prevent flare ups.  Referral to [facility].  Get records from [facility].  Check labs including CBC, CMP and lipase to r/o pancreatitis.  Follow up here for a fasting CPE.    Return for fasting CPE at their convenience.   Hetty Blend, NP-C

## 2023-06-10 NOTE — Patient Instructions (Signed)
Start pantoprazole daily in the mornings before breakfast.   Avoid foods that trigger your acid reflux.   I am referring you to [facility]

## 2023-07-03 ENCOUNTER — Telehealth: Payer: Self-pay | Admitting: Gastroenterology

## 2023-07-03 NOTE — Telephone Encounter (Signed)
Good morning Dr. Lavon Paganini  The following patient is being referred to [place] for Gastroesophageal reflux disease with esophagitis without hemorrhage, vomiting, and a History of esophagogastroduodenoscopy. The patient has seen [facility] and was recommended by pcp to stay within the [facility]. Records are available with care everywhere. Please review and advise of scheduling. Thank you.

## 2023-07-04 NOTE — Telephone Encounter (Signed)
Please check if any of the other providers can help with the consult, I dont have any available appointments

## 2023-07-04 NOTE — Telephone Encounter (Signed)
According to his recent primary care note, he will be taking a job with [employer].  I can see this patient, but my next available new patient appointment is probably 2 months away. If he were to come here, I would need his records from [facility] at least by the day he comes for clinic visit.  Ellwood Dense MD

## 2023-07-04 NOTE — Telephone Encounter (Signed)
Good afternoon  Dr. Myrtie Neither? Dr. Tomasa Rand? Would either of you consider taking over this patient? Please review and advise of scheduling. Thank you

## 2023-07-09 NOTE — Telephone Encounter (Signed)
 Request for records sent to [facility]. Waiting on an update

## 2023-08-04 ENCOUNTER — Encounter: Payer: Self-pay | Admitting: Gastroenterology

## 2023-09-24 ENCOUNTER — Ambulatory Visit: Payer: Self-pay | Admitting: Gastroenterology

## 2023-09-24 ENCOUNTER — Encounter: Payer: Self-pay | Admitting: Gastroenterology

## 2023-09-24 VITALS — BP 120/82 | HR 94 | Ht 66.0 in | Wt 227.0 lb

## 2023-09-24 DIAGNOSIS — R110 Nausea: Secondary | ICD-10-CM

## 2023-09-24 DIAGNOSIS — K219 Gastro-esophageal reflux disease without esophagitis: Secondary | ICD-10-CM

## 2023-09-24 DIAGNOSIS — K2100 Gastro-esophageal reflux disease with esophagitis, without bleeding: Secondary | ICD-10-CM

## 2023-09-24 MED ORDER — PANTOPRAZOLE SODIUM 40 MG PO TBEC: 40.0000 mg | DELAYED_RELEASE_TABLET | Freq: Every day | ORAL | 6 refills | Status: AC

## 2023-09-24 NOTE — Patient Instructions (Addendum)
 We have sent the following medications to your pharmacy for you to pick up at your convenience:  Pantoprazole    Follow-up on: 11/05/23 at 9:20 am with [facility]  _______________________________________________________  If your blood pressure at your visit was 140/90 or greater, please contact your primary care physician to follow up on this.  _______________________________________________________  If you are age 31 or older, your body mass index should be between 23-30. Your Body mass index is 36.64 kg/m. If this is out of the aforementioned range listed, please consider follow up with your Primary Care Provider.  If you are age 52 or younger, your body mass index should be between 19-25. Your Body mass index is 36.64 kg/m. If this is out of the aformentioned range listed, please consider follow up with your Primary Care Provider.   ________________________________________________________  The [facility] providers would like to encourage you to use MYCHART to communicate with providers for non-urgent requests or questions.  Due to long hold times on the telephone, sending your provider a message by [facility] may be a faster and more efficient way to get a response.  Please allow 48 business hours for a response.  Please remember that this is for non-urgent requests.  _______________________________________________________  Thank you for choosing me and [facility].

## 2023-09-24 NOTE — Progress Notes (Signed)
 Chief Complaint: Transfer of care Primary GI MD: Dr. Cherryl Corona  HPI: Discussed the use of AI scribe software for clinical note transcription with the patient, who gave verbal consent to proceed.  History of Present Illness Ian Lewis is a 33 year old male with gastroesophageal reflux disease who presents for a second opinion regarding persistent acid reflux symptoms. He was referred by his new regular physician for evaluation of persistent gastroesophageal symptoms.  He has ongoing symptoms of acid reflux and gastrointestinal discomfort. An endoscopy in 2024 revealed esophagitis and intestinal inflammation. Treatment with pantoprazole  twice daily for six months effectively controlled his symptoms, but discontinuation led to recurrence.  Current symptoms include burning in the chest and nausea, particularly associated with eating. Despite avoiding fast foods and attempting to eat healthily, he experiences symptoms even with salads. Over-the-counter nausea medicine provides some relief but is costly.  He experiences intermittent abdominal cramps. No blood in stool or black stools. There is no family history of colon cancer. He reports occasional episodes of vomiting bile in the morning, attributing it to overnight acid accumulation.  His work schedule from 2 PM to 10 PM affects his eating habits, with dinner at 5 PM and a small snack around 8 or 9 PM. Adjusting his eating time to avoid eating close to bedtime has helped reduce symptoms.   PREVIOUS GI WORKUP   EGD 09/24/2022 for nausea and vomiting - LA grade a reflux esophagitis - Erythematous mucosa in the prepyloric region of the stomach and gastric antrum.  Biopsied - Normal duodenum - Pathology showing chronic inactive gastritis with reactive epithelium.  Negative for H. 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 Added automatically from request for surgery [id number]   PSVT (paroxysmal supraventricular tachycardia) (HCC) 02/11/2019   Sensory loss 06/12/2018   Status post placement of implantable loop recorder 02/11/2019   SVT (supraventricular tachycardia) (HCC)    Syncope    Vision abnormalities    Weakness of both legs 06/12/2018    Past Surgical History:  Procedure Laterality Date   KNEE SURGERY     LOOP RECORDER INSERTION N/A 02/05/2018   Procedure: LOOP RECORDER INSERTION;  Surgeon: Lei Pump, MD;  Location: [facility];  Service: Cardiovascular;  Laterality: N/A;   MANDIBLE FRACTURE SURGERY      Current Outpatient Medications  Medication Sig Dispense Refill   propranolol  ER (INDERAL  LA) 60 MG 24 hr capsule Take 1 capsule (60 mg total) by mouth daily. To help prevent SVT. 90 capsule 3   pantoprazole  (PROTONIX ) 40 MG tablet Take 1 tablet (40 mg total) by mouth daily. 30 tablet 6   No current facility-administered medications for this visit.    Allergies as of 09/24/2023 - Review Complete 09/24/2023  Allergen Reaction Noted   Loracarbef Hives 04/14/2011    Family History  Problem Relation Age of Onset   Atrial fibrillation Father     Social History   Socioeconomic History   Marital status: Single    Spouse name: Not on file   Number of children: Not on file   Years of education: Not on file   Highest education level: Not on file  Occupational History   Not on file  Tobacco Use   Smoking status: Never   Smokeless tobacco: Never  Substance and Sexual Activity  Alcohol use: No   Drug use: No   Sexual activity: Not on file  Other Topics Concern   Not on file  Social History Narrative   Not on file   Social Drivers of Health   Financial Resource Strain: Not on file  Food Insecurity: Not on file  Transportation Needs: Not on file  Physical Activity: Not on file  Stress: Not on file  Social  Connections: Not on file  Intimate Partner Violence: Not on file    Review of Systems:    Constitutional: No weight loss, fever, chills, weakness or fatigue HEENT: Eyes: No change in vision               Ears, Nose, Throat:  No change in hearing or congestion Skin: No rash or itching Cardiovascular: No chest pain, chest pressure or palpitations   Respiratory: No SOB or cough Gastrointestinal: See HPI and otherwise negative Genitourinary: No dysuria or change in urinary frequency Neurological: No headache, dizziness or syncope Musculoskeletal: No new muscle or joint pain Hematologic: No bleeding or bruising Psychiatric: No history of depression or anxiety    Physical Exam:  Vital signs: BP 120/82   Pulse 94   Ht 5\' 6"  (1.676 m)   Wt 227 lb (103 kg)   BMI 36.64 kg/m   Constitutional: NAD, alert and cooperative Head:  Normocephalic and atraumatic. Eyes:   PEERL, EOMI. No icterus. Conjunctiva pink. Respiratory: Respirations even and unlabored. Lungs clear to auscultation bilaterally.   No wheezes, crackles, or rhonchi.  Cardiovascular:  Regular rate and rhythm. No peripheral edema, cyanosis or pallor.  Gastrointestinal:  Soft, nondistended, nontender. No rebound or guarding. Normal bowel sounds. No appreciable masses or hepatomegaly. Rectal:  Declines Msk:  Symmetrical without gross deformities. Without edema, no deformity or joint abnormality.  Neurologic:  Alert and  oriented x4;  grossly normal neurologically.  Skin:   Dry and intact without significant lesions or rashes. Psychiatric: Oriented to person, place and time. Demonstrates good judgement and reason without abnormal affect or behaviors.  RELEVANT LABS AND IMAGING: CBC    Component Value Date/Time   WBC 6.5 06/10/2023 1037   RBC 5.47 06/10/2023 1037   HGB 16.4 06/10/2023 1037   HCT 47.2 06/10/2023 1037   PLT 275.0 06/10/2023 1037   MCV 86.3 06/10/2023 1037   MCH 29.2 06/28/2022 1719   MCHC 34.7 06/10/2023  1037   RDW 13.5 06/10/2023 1037   LYMPHSABS 1.8 06/10/2023 1037   MONOABS 0.5 06/10/2023 1037   EOSABS 0.2 06/10/2023 1037   BASOSABS 0.1 06/10/2023 1037    CMP     Component Value Date/Time   NA 137 06/10/2023 1037   K 4.2 06/10/2023 1037   CL 103 06/10/2023 1037   CO2 28 06/10/2023 1037   GLUCOSE 99 06/10/2023 1037   BUN 14 06/10/2023 1037   CREATININE 0.92 06/10/2023 1037   CALCIUM 9.4 06/10/2023 1037   PROT 7.2 06/10/2023 1037   ALBUMIN 4.4 06/10/2023 1037   AST 19 06/10/2023 1037   ALT 16 06/10/2023 1037   ALKPHOS 69 06/10/2023 1037   BILITOT 1.1 06/10/2023 1037   GFRNONAA >60 06/28/2022 1719   GFRAA >60 03/12/2018 1621     Assessment/Plan:   Assessment & Plan  GERD Nausea Chronic GERD with persistent symptoms of heartburn and nausea. Previous endoscopy showed esophagitis. Symptoms returned after stopping pantoprazole . No H. pylori or Barrett's esophagus. Emphasized dietary and lifestyle changes. -- Restart pantoprazole  40mg  once daily. -- Educate on dietary  modifications and foods to avoid, including acidic foods like tomatoes, pasta, lemons, and oranges. -- Advise to avoid eating close to bedtime and maintain a gap of at least three hours between eating and lying down. -- Suggest weight loss to reduce reflux symptoms. -- Discuss use of a GERD bed wedge to elevate the torso during sleep to prevent acid reflux. - Encourage communication via MyChart for any issues with medication efficacy or side effects. -- Provided patient education handouts -- follow up 8 weeks     Babita Amaker [facility] 09/24/2023, 10:44 AM  Cc: Henson, Vickie L, NP-C

## 2023-10-10 NOTE — Progress Notes (Signed)
 Agree with the assessment and plan as outlined by Boone Master, PA-C.

## 2023-11-05 ENCOUNTER — Ambulatory Visit: Payer: Self-pay | Admitting: Gastroenterology
